# Patient Record
Sex: Male | Born: 1935 | Race: White | Hispanic: No | Marital: Married | Smoking: Former smoker
Health system: Southern US, Community
[De-identification: ages and names within clinical notes are randomized; demographics above are authoritative.]

## PROBLEM LIST (undated history)

## (undated) DIAGNOSIS — M1990 Unspecified osteoarthritis, unspecified site: Secondary | ICD-10-CM

## (undated) DIAGNOSIS — E119 Type 2 diabetes mellitus without complications: Secondary | ICD-10-CM

## (undated) DIAGNOSIS — I482 Chronic atrial fibrillation, unspecified: Secondary | ICD-10-CM

## (undated) DIAGNOSIS — E559 Vitamin D deficiency, unspecified: Secondary | ICD-10-CM

## (undated) DIAGNOSIS — N200 Calculus of kidney: Secondary | ICD-10-CM

## (undated) DIAGNOSIS — Z95810 Presence of automatic (implantable) cardiac defibrillator: Secondary | ICD-10-CM

## (undated) DIAGNOSIS — I428 Other cardiomyopathies: Secondary | ICD-10-CM

## (undated) DIAGNOSIS — K228 Other specified diseases of esophagus: Secondary | ICD-10-CM

## (undated) DIAGNOSIS — I429 Cardiomyopathy, unspecified: Secondary | ICD-10-CM

## (undated) DIAGNOSIS — E039 Hypothyroidism, unspecified: Secondary | ICD-10-CM

## (undated) DIAGNOSIS — I10 Essential (primary) hypertension: Secondary | ICD-10-CM

## (undated) DIAGNOSIS — K219 Gastro-esophageal reflux disease without esophagitis: Secondary | ICD-10-CM

## (undated) DIAGNOSIS — I5022 Chronic systolic (congestive) heart failure: Secondary | ICD-10-CM

## (undated) DIAGNOSIS — E785 Hyperlipidemia, unspecified: Secondary | ICD-10-CM

## (undated) DIAGNOSIS — J309 Allergic rhinitis, unspecified: Secondary | ICD-10-CM

## (undated) DIAGNOSIS — K2281 Esophageal polyp: Secondary | ICD-10-CM

## (undated) DIAGNOSIS — Z85828 Personal history of other malignant neoplasm of skin: Secondary | ICD-10-CM

## (undated) HISTORY — PX: ESOPHAGOGASTRODUODENOSCOPY: SHX1529

## (undated) HISTORY — PX: COLONOSCOPY: SHX174

## (undated) HISTORY — DX: Esophageal polyp: K22.81

## (undated) HISTORY — PX: CATARACT EXTRACTION: SUR2

## (undated) HISTORY — DX: Personal history of other malignant neoplasm of skin: Z85.828

## (undated) HISTORY — DX: Hyperlipidemia, unspecified: E78.5

## (undated) HISTORY — DX: Vitamin D deficiency, unspecified: E55.9

## (undated) HISTORY — DX: Cardiomyopathy, unspecified: I42.9

## (undated) HISTORY — DX: Presence of automatic (implantable) cardiac defibrillator: Z95.810

## (undated) HISTORY — DX: Gastro-esophageal reflux disease without esophagitis: K21.9

## (undated) HISTORY — DX: Unspecified osteoarthritis, unspecified site: M19.90

## (undated) HISTORY — DX: Other specified diseases of esophagus: K22.8

## (undated) HISTORY — DX: Type 2 diabetes mellitus without complications: E11.9

## (undated) HISTORY — DX: Chronic atrial fibrillation, unspecified: I48.20

## (undated) HISTORY — DX: Calculus of kidney: N20.0

## (undated) HISTORY — PX: OTHER SURGICAL HISTORY: SHX169

## (undated) HISTORY — DX: Allergic rhinitis, unspecified: J30.9

## (undated) HISTORY — DX: Hypothyroidism, unspecified: E03.9

## (undated) HISTORY — DX: Essential (primary) hypertension: I10

## (undated) HISTORY — DX: Other cardiomyopathies: I42.8

## (undated) HISTORY — DX: Chronic systolic (congestive) heart failure: I50.22

## (undated) HISTORY — PX: TONSILLECTOMY: SUR1361

---

## 2013-09-09 HISTORY — PX: CARDIAC CATHETERIZATION: SHX172

## 2014-08-09 DIAGNOSIS — Z95810 Presence of automatic (implantable) cardiac defibrillator: Secondary | ICD-10-CM

## 2014-08-09 HISTORY — DX: Presence of automatic (implantable) cardiac defibrillator: Z95.810

## 2015-01-07 HISTORY — PX: CARDIAC DEFIBRILLATOR PLACEMENT: SHX171

## 2017-02-08 ENCOUNTER — Encounter: Payer: Self-pay | Admitting: *Deleted

## 2017-02-08 ENCOUNTER — Encounter: Payer: Self-pay | Admitting: Cardiology

## 2017-02-08 NOTE — Progress Notes (Signed)
Cardiology Office Note  Date: 02/10/2017   ID: Marvin Sanchez, DOB 1934/10/15, MRN [id number]  PCP: Eber Hong, MD  Consulting Cardiologist: Rozann Lesches, MD   Chief Complaint  Patient presents with  . Cardiomyopathy  . Atrial Fibrillation    History of Present Illness: Marvin Sanchez is a 82 y.o. male referred for cardiology consultation by Dr. Brynda Greathouse to establish follow-up of cardiomyopathy and atrial fibrillation. He has been followed long-term by the [facility] cardiology practice, I reviewed the available records and updated the chart. History includes a nonischemic cardiomyopathy as well as chronic atrial fibrillation, reportedly with normal coronary arteries at cardiac catheterization in 2015. He also apparently had moderate to severe mitral regurgitation as of 2015, however this has improved by subsequent echocardiography. He underwent placement of an ICD in 2016.  He is here today with his daughter. They voice frustration with his previous cardiology practice. He has had increasing fatigue and shortness of breath over the last year, no exertional chest pain or palpitations. He states that his weights have been very stable, he rarely has to use any diuretic, has Bumex 1 mg tablets at home as needed.  Current medications reviewed below. He reports no recent changes. He states that he has been compliant with therapy. His last echocardiogram was in 2016 as summarized below.  He checks blood pressure at home, reports systolics generally in the 120s to 130s.  He does not report any syncope or device shocks.  I personally reviewed his ECG today which shows rate-controlled atrial fibrillation with low voltage in the limb leads.  Past Medical History:  Diagnosis Date  . Allergic rhinitis   . Cardiac defibrillator in place 2016   [place] EL ICD DF4 VR model # W6997659, serial [id number]   . Cardiomyopathy (HCC)    LVEF 25-30%  . Chronic atrial fibrillation ([place])   . Chronic  systolic heart failure ([place])   . Esophageal polyp   . Essential hypertension   . GERD (gastroesophageal reflux disease)   . History of skin cancer    Squamous cell  . Hyperlipidemia   . Hypothyroidism   . Nonischemic cardiomyopathy (HCC)    Normal coronary arteries at cardiac catheterization 2015  . Osteoarthritis   . Renal calculi   . Type 2 diabetes mellitus ([place])   . Vitamin D deficiency     Past Surgical History:  Procedure Laterality Date  . CARDIAC CATHETERIZATION  09/2013  . CATARACT EXTRACTION    . COLONOSCOPY    . ESOPHAGOGASTRODUODENOSCOPY    . SKIN CANCER REMOVAL     . TONSILLECTOMY      Current Outpatient Prescriptions  Medication Sig Dispense Refill  . amLODipine (NORVASC) 2.5 MG tablet Take 2.5 mg by mouth daily.    . cetirizine (ZYRTEC) 10 MG tablet Take 10 mg by mouth daily.    . cholecalciferol (VITAMIN D) 1000 units tablet Take 1,000 Units by mouth daily.    Marland Kitchen gabapentin (NEURONTIN) 300 MG capsule Take 300 mg by mouth 2 (two) times daily.    . irbesartan (AVAPRO) 150 MG tablet Take 150 mg by mouth daily.    Marland Kitchen levothyroxine (SYNTHROID, LEVOTHROID) 25 MCG tablet Take 25 mcg by mouth daily before breakfast.    . metoprolol succinate (TOPROL-XL) 100 MG 24 hr tablet Take 100 mg by mouth 2 (two) times daily.     . NON FORMULARY Inject 1,000 mcg into the muscle every 30 (thirty) days.    Marland Kitchen omeprazole (PRILOSEC) 20  MG capsule Take 20 mg by mouth daily.    . Rivaroxaban (XARELTO) 15 MG TABS tablet Take 15 mg by mouth daily with supper.     No current facility-administered medications for this visit.    Allergies:  Hyzaar [losartan potassium-hctz]   Social History: The patient  reports that he quit smoking about 48 years ago. His smoking use included Cigarettes. He has never used smokeless tobacco. He reports that he does not drink alcohol or use drugs.   Family History: The patient's family history includes Diabetes Mellitus II in his brother and sister; Heart  failure in his father; Prostate cancer in his father; Stroke in his mother.   ROS:  Please see the history of present illness. Otherwise, complete review of systems is positive for NYHA class 2-3 dyspnea.  All other systems are reviewed and negative.   Physical Exam: VS:  BP (!) 152/98 (BP Location: Right Arm, Cuff Size: Normal)   Pulse 76   Ht 5\' 10"  (1.778 m)   Wt 188 lb (85.3 kg)   SpO2 98%   BMI 26.98 kg/m , BMI Body mass index is 26.98 kg/m.  Wt Readings from Last 3 Encounters:  02/10/17 188 lb (85.3 kg)    General: Elderly male, appears comfortable at rest. HEENT: Conjunctiva and lids normal, oropharynx clear. Neck: Supple, no elevated JVP or carotid bruits, no thyromegaly. Lungs: Clear to auscultation, nonlabored breathing at rest. Cardiac: Irregularly irregular, no S3 or significant systolic murmur, no pericardial rub. Abdomen: Soft, nontender, bowel sounds present, no guarding or rebound. Extremities: No pitting edema, distal pulses 2+. Skin: Warm and dry. Musculoskeletal: No kyphosis. Neuropsychiatric: Alert and oriented x3, affect grossly appropriate.  ECG: Unable to review prior tracings.  Recent Labwork:  March 2018: Hemoglobin 17.1, platelets 228, potassium 4.6, BUN 25, creatinine 1.6, hemoglobin A1c 6.5, AST 20, ALT 24, cholesterol 185, triglycerides 189, HDL 34, LDL 114  Other Studies Reviewed Today:  Echocardiogram 11/07/2014 [facility]): Moderate LVH with LVEF 25-30%, normal right ventricular contraction, moderate left atrial enlargement, moderate right atrial enlargement, trivial mitral regurgitation, mild tricuspid regurgitation with RVSP 33 mmHg, normal aortic valve, no pericardial effusion  Assessment and Plan:  1. Nonischemic cardiomyopathy, LVEF 25-30% as of 2016. He had reportedly normal coronary arteries at cardiac catheterization in 2015. He reports exertional fatigue and shortness of breath over the last year, although stable weights, no orthopnea or  leg edema. Plan at this time is to obtain a follow-up echocardiogram and consider the possibility of medication adjustments as warranted. He does have renal insufficiency which limits some options.  2. [employer] ICD in place, no reported syncope or device shocks. He will be established in our device clinic with Dr. Rayann Heman.  3. Essential hypertension, medications currently include Toprol-XL, Avapro, and Norvasc.  4. CKD stage III, creatinine 1.6.  5. Persistent atrial fibrillation, heart rate controlled today. He is on renally dosed Xarelto for stroke prophylaxis.  Current medicines were reviewed with the patient today.   Orders Placed This Encounter  Procedures  . Ambulatory referral to Cardiac Electrophysiology  . EKG 12-Lead  . ECHOCARDIOGRAM COMPLETE    Disposition: Follow-up in the next 3 or 4 weeks.  Signed, Satira Sark, MD, [facility] 02/10/2017 1:48 PM    [facility] [place], [place],  [address] Phone: [phone]; Fax: [phone]

## 2017-02-10 ENCOUNTER — Encounter: Payer: Self-pay | Admitting: Cardiology

## 2017-02-10 ENCOUNTER — Ambulatory Visit (INDEPENDENT_AMBULATORY_CARE_PROVIDER_SITE_OTHER): Payer: Medicare Other | Admitting: Cardiology

## 2017-02-10 VITALS — BP 152/98 | HR 76 | Ht 70.0 in | Wt 188.0 lb

## 2017-02-10 DIAGNOSIS — N183 Chronic kidney disease, stage 3 unspecified: Secondary | ICD-10-CM

## 2017-02-10 DIAGNOSIS — I10 Essential (primary) hypertension: Secondary | ICD-10-CM

## 2017-02-10 DIAGNOSIS — Z95810 Presence of automatic (implantable) cardiac defibrillator: Secondary | ICD-10-CM

## 2017-02-10 DIAGNOSIS — I482 Chronic atrial fibrillation, unspecified: Secondary | ICD-10-CM

## 2017-02-10 DIAGNOSIS — I428 Other cardiomyopathies: Secondary | ICD-10-CM | POA: Diagnosis not present

## 2017-02-10 NOTE — Patient Instructions (Signed)
Medication Instructions:  Your physician recommends that you continue on your current medications as directed. Please refer to the Current Medication list given to you today.  Labwork: NONE  Testing/Procedures: Your physician has requested that you have an echocardiogram. Echocardiography is a painless test that uses sound waves to create images of your heart. It provides your doctor with information about the size and shape of your heart and how well your heart's chambers and valves are working. This procedure takes approximately one hour. There are no restrictions for this procedure.  Follow-Up: Your physician recommends that you schedule a follow-up appointment in: [place]  Any Other Special Instructions Will Be Listed Below (If Applicable). You have been referred to DR. ALLRED  If you need a refill on your cardiac medications before your next appointment, please call your pharmacy.

## 2017-02-15 ENCOUNTER — Other Ambulatory Visit: Payer: Self-pay | Admitting: *Deleted

## 2017-02-15 MED ORDER — METOPROLOL SUCCINATE ER 100 MG PO TB24: 100.0000 mg | ORAL_TABLET | Freq: Two times a day (BID) | ORAL | 3 refills | Status: DC

## 2017-02-15 MED ORDER — IRBESARTAN 150 MG PO TABS: 150.0000 mg | ORAL_TABLET | Freq: Every day | ORAL | 3 refills | Status: DC

## 2017-02-23 ENCOUNTER — Ambulatory Visit (INDEPENDENT_AMBULATORY_CARE_PROVIDER_SITE_OTHER): Payer: Medicare Other

## 2017-02-23 ENCOUNTER — Other Ambulatory Visit: Payer: Self-pay

## 2017-02-23 DIAGNOSIS — I428 Other cardiomyopathies: Secondary | ICD-10-CM

## 2017-02-24 ENCOUNTER — Telehealth: Payer: Self-pay

## 2017-02-24 NOTE — Telephone Encounter (Signed)
Patient notified. Patient does not wish to change medication at this time. Routed to PCP

## 2017-02-24 NOTE — Telephone Encounter (Signed)
-----   Message from Acquanetta Chain, LPN sent at 1/61/0960  7:53 AM EDT -----   ----- Message ----- From: Satira Sark, MD Sent: 02/23/2017   4:56 PM To: Merlene Laughter, LPN  Results reviewed. LVEF now in 40-45% range (had been worse in past based on records) and degree of mitral regurgitation is only mild (had been severe). We could consider trying a switch from Avapro to [facility] beginning at 24/26 mg BID. If change is made would check BMET in 2 weeks. Keep follow-up visit. A copy of this test should be forwarded to Eber Hong, MD.

## 2017-02-28 ENCOUNTER — Ambulatory Visit (INDEPENDENT_AMBULATORY_CARE_PROVIDER_SITE_OTHER): Payer: Medicare Other | Admitting: Internal Medicine

## 2017-02-28 ENCOUNTER — Encounter: Payer: Self-pay | Admitting: Internal Medicine

## 2017-02-28 VITALS — BP 138/98 | HR 92 | Ht 70.0 in | Wt 188.2 lb

## 2017-02-28 DIAGNOSIS — I428 Other cardiomyopathies: Secondary | ICD-10-CM | POA: Diagnosis not present

## 2017-02-28 DIAGNOSIS — I4821 Permanent atrial fibrillation: Secondary | ICD-10-CM

## 2017-02-28 DIAGNOSIS — I482 Chronic atrial fibrillation: Secondary | ICD-10-CM

## 2017-02-28 DIAGNOSIS — Z95810 Presence of automatic (implantable) cardiac defibrillator: Secondary | ICD-10-CM | POA: Diagnosis not present

## 2017-02-28 NOTE — Progress Notes (Signed)
Marvin Hong, MD: Primary Cardiologist:  Dr Evangeline Gula is a 82 y.o. male with a h/o nonischemic CM, chronic systolic dysfunction and permanent afib sp ICD [facility]) by [employer] physicians for primary prevention of sudden death who presents today to establish care in the Electrophysiology device clinic.   The patient reports doing very well since having his ICD implanted and remains very active despite his age.  He has permanent afib for which he is rate controlled and anticoagulated.  Today, he  denies symptoms of palpitations, chest pain, shortness of breath, orthopnea, PND, lower extremity edema, dizziness, presyncope, syncope, or neurologic sequela.  The patientis tolerating medications without difficulties and is otherwise without complaint today.   Past Medical History:  Diagnosis Date  . Allergic rhinitis   . Cardiac defibrillator in place 2016   [place] EL ICD DF4 VR model # [id number], serial [id number]   . Cardiomyopathy (HCC)    LVEF 25-30%  . Chronic atrial fibrillation ([place])   . Chronic systolic heart failure ([place])   . Esophageal polyp   . Essential hypertension   . GERD (gastroesophageal reflux disease)   . History of skin cancer    Squamous cell  . Hyperlipidemia   . Hypothyroidism   . Nonischemic cardiomyopathy (HCC)    Normal coronary arteries at cardiac catheterization 2015  . Osteoarthritis   . Renal calculi   . Type 2 diabetes mellitus ([place])   . Vitamin D deficiency    Past Surgical History:  Procedure Laterality Date  . CARDIAC CATHETERIZATION  09/2013  . CATARACT EXTRACTION    . COLONOSCOPY    . ESOPHAGOGASTRODUODENOSCOPY    . SKIN CANCER REMOVAL     . TONSILLECTOMY      Social History   Social History  . Marital status: Married    Spouse name: N/A  . Number of children: N/A  . Years of education: N/A   Occupational History  . Not on file.   Social History Main Topics  . Smoking status: Former Smoker    Types:  Cigarettes    Quit date: 08/11/1968  . Smokeless tobacco: Never Used  . Alcohol use No  . Drug use: No  . Sexual activity: Not on file   Other Topics Concern  . Not on file   Social History Narrative   Lives in [place] (between [place] and [place]).   Retired from [employer] Engineer, production)    Family History  Problem Relation Age of Onset  . Prostate cancer Father   . Heart failure Father   . Stroke Mother   . Diabetes Mellitus II Sister   . Diabetes Mellitus II Brother     Allergies  Allergen Reactions  . Hyzaar [Losartan Potassium-Hctz]     Affected his kidneys     Current Outpatient Prescriptions  Medication Sig Dispense Refill  . amLODipine (NORVASC) 2.5 MG tablet Take 2.5 mg by mouth daily.    . cetirizine (ZYRTEC) 10 MG tablet Take 10 mg by mouth daily.    . cholecalciferol (VITAMIN D) 1000 units tablet Take 1,000 Units by mouth daily.    . Cyanocobalamin 1000 MCG/ML KIT Inject 1,000 mcg as directed every 30 (thirty) days.    Marland Kitchen gabapentin (NEURONTIN) 300 MG capsule Take 300 mg by mouth 2 (two) times daily.    . irbesartan (AVAPRO) 150 MG tablet Take 1 tablet (150 mg total) by mouth daily. 90 tablet 3  . levothyroxine (SYNTHROID, LEVOTHROID) 25  MCG tablet Take 25 mcg by mouth daily before breakfast.    . metoprolol succinate (TOPROL-XL) 100 MG 24 hr tablet Take 1 tablet (100 mg total) by mouth 2 (two) times daily. 180 tablet 3  . omeprazole (PRILOSEC) 20 MG capsule Take 20 mg by mouth daily.    . Rivaroxaban (XARELTO) 15 MG TABS tablet Take 15 mg by mouth daily with supper.     No current facility-administered medications for this visit.     ROS- all systems are reviewed and negative except as per HPI  Physical Exam: Vitals:   02/28/17 1510  BP: (!) 138/98  Pulse: 92  SpO2: 96%  Weight: 188 lb 3.2 oz (85.4 kg)  Height: '5\' 10"'  (1.778 m)    GEN- The patient is well appearing, alert and oriented x 3 today.   Head- normocephalic,  atraumatic Eyes-  Sclera clear, conjunctiva pink Ears- hearing intact Oropharynx- clear Neck- supple,   Lungs- Clear to ausculation bilaterally, normal work of breathing Chest- ICD pocket is well healed Heart- irregular rate and rhythm, no murmurs, rubs or gallops, PMI not laterally displaced GI- soft, NT, ND, + BS Extremities- no clubbing, cyanosis, or edema MS- no significant deformity or atrophy Skin- no rash or lesion Psych- euthymic mood, full affect Neuro- strength and sensation are intact  ICD interrogation- reviewed in detail today,  See PACEART report  Echo 02/23/17 reveals EF 40-45%, mild MR, severe LA enlargement  ekg 02/10/17 reveals afib, V rate 73 bpm  Labs in epic reviewed,  CrCl is 45  Assessment and Plan:   1. Nonischemic CM/ chronic systolic dysfunction euvolemic today On good medical therapy S/p ICD for primary prevention of sudden death Normal ICD function No changes today Will follow remotely with [facility]  2. Permanent afib ICD interrogation reveals very good rate control On xarelto.   (creat clearance if 45)  [facility] Follow-up with Dr Domenic Polite as scheduled I will see again in [place] in a year  Thompson Grayer MD, [facility] 02/28/2017 3:57 PM

## 2017-02-28 NOTE — Patient Instructions (Addendum)
Medication Instructions:  Your physician recommends that you continue on your current medications as directed. Please refer to the Current Medication list given to you today.   Labwork: None ordered   Testing/Procedures: None ordered   Follow-Up: Remote monitoring is used to monitor your ICD from home. This monitoring reduces the number of office visits required to check your device to one time per year. It allows [place] to keep an eye on the functioning of your device to ensure it is working properly. You are scheduled for a device check from home on 05/30/17. You may send your transmission at any time that day. If you have a wireless device, the transmission will be sent automatically. After your physician reviews your transmission, you will receive a postcard with your next transmission date.  Remote monitoring is used to monitor your ICD from home. This monitoring reduces the number of office visits required to check your device to one time per year. It allows [place] to keep an eye on the functioning of your device to ensure it is working properly. You are scheduled for a device check from home on 12 months with Dr Rayann Heman in [place]. You may send your transmission at any time that day. If you have a wireless device, the transmission will be sent automatically. After your physician reviews your transmission, you will receive a postcard with your next transmission date.      Any Other Special Instructions Will Be Listed Below (If Applicable).     If you need a refill on your cardiac medications before your next appointment, please call your pharmacy.

## 2017-03-04 LAB — CUP PACEART INCLINIC DEVICE CHECK
HighPow Impedance: 54 Ohm
Implantable Lead Implant Date: 20160531
Implantable Lead Model: 296
Implantable Lead Serial Number: 129098
Lead Channel Pacing Threshold Pulse Width: 0.4 ms
Lead Channel Sensing Intrinsic Amplitude: 7.2 mV
MDC IDC LEAD LOCATION: 753860
MDC IDC MSMT LEADCHNL RV IMPEDANCE VALUE: 452 Ohm
MDC IDC MSMT LEADCHNL RV PACING THRESHOLD AMPLITUDE: 1 V
MDC IDC PG IMPLANT DT: 20160531
MDC IDC PG SERIAL: 206098
MDC IDC SESS DTM: 20180723040000
MDC IDC SET LEADCHNL RV PACING AMPLITUDE: 2.5 V
MDC IDC SET LEADCHNL RV PACING PULSEWIDTH: 0.4 ms
MDC IDC SET LEADCHNL RV SENSING SENSITIVITY: 0.6 mV

## 2017-03-08 NOTE — Progress Notes (Signed)
Cardiology Office Note  Date: 03/09/2017   ID: RAKAN SOFFER, DOB 27-Apr-1935, MRN [id number]  PCP: Eber Hong, MD  Primary Cardiologist: Rozann Lesches, MD   Chief Complaint  Patient presents with  . Cardiomyopathy    History of Present Illness: Marvin Sanchez is a 82 y.o. male that I met recently in early July. He is here today with his daughter for a follow-up visit. He tells me today that he actually feels better, less fatigue, not particularly short of breath, and not experiencing any exertional chest pain. His weight has been stable. No orthopnea or PND.  At the last visit we arranged a follow-up echocardiogram which revealed LVEF 40-45%, somewhat better than previously assessed. Mitral regurgitation was only mild. I went over the results with him today. He reports compliance with his medications, outlined below. I did talk about the possibility of switching to [facility], but he is feeling better and comfortable remaining on the current regimen.  He has established in the device clinic with Dr. Rayann Heman, recent visit noted. He has a Chemical engineer ICD in place. He reports no palpitations or syncope.  Past Medical History:  Diagnosis Date  . Allergic rhinitis   . Cardiac defibrillator in place 2016   [place] EL ICD DF4 VR model # [id number], serial [id number]   . Cardiomyopathy (HCC)    LVEF 25-30%  . Chronic atrial fibrillation ([place])   . Chronic systolic heart failure ([place])   . Esophageal polyp   . Essential hypertension   . GERD (gastroesophageal reflux disease)   . History of skin cancer    Squamous cell  . Hyperlipidemia   . Hypothyroidism   . Nonischemic cardiomyopathy (HCC)    Normal coronary arteries at cardiac catheterization 2015  . Osteoarthritis   . Renal calculi   . Type 2 diabetes mellitus ([place])   . Vitamin D deficiency     Past Surgical History:  Procedure Laterality Date  . CARDIAC CATHETERIZATION  09/2013  . CARDIAC DEFIBRILLATOR PLACEMENT   01/07/2015   [employer] VR ICD implanted by [facility] [employer] in [place]  . CATARACT EXTRACTION    . COLONOSCOPY    . ESOPHAGOGASTRODUODENOSCOPY    . SKIN CANCER REMOVAL     . TONSILLECTOMY      Current Outpatient Prescriptions  Medication Sig Dispense Refill  . amLODipine (NORVASC) 2.5 MG tablet Take 2.5 mg by mouth daily.    . cetirizine (ZYRTEC) 10 MG tablet Take 10 mg by mouth daily.    . cholecalciferol (VITAMIN D) 1000 units tablet Take 1,000 Units by mouth daily.    . Cyanocobalamin 1000 MCG/ML KIT Inject 1,000 mcg as directed every 30 (thirty) days.    Marland Kitchen gabapentin (NEURONTIN) 300 MG capsule Take 300 mg by mouth 2 (two) times daily.    . irbesartan (AVAPRO) 150 MG tablet Take 1 tablet (150 mg total) by mouth daily. 90 tablet 3  . levothyroxine (SYNTHROID, LEVOTHROID) 25 MCG tablet Take 25 mcg by mouth daily before breakfast.    . metoprolol succinate (TOPROL-XL) 100 MG 24 hr tablet Take 1 tablet (100 mg total) by mouth 2 (two) times daily. 180 tablet 3  . omeprazole (PRILOSEC) 20 MG capsule Take 20 mg by mouth daily.    . Rivaroxaban (XARELTO) 15 MG TABS tablet Take 15 mg by mouth daily with supper.     No current facility-administered medications for this visit.    Allergies:  Hyzaar [losartan potassium-hctz]   Social  History: The patient  reports that he quit smoking about 48 years ago. His smoking use included Cigarettes. He has never used smokeless tobacco. He reports that he does not drink alcohol or use drugs.   ROS:  Please see the history of present illness. Otherwise, complete review of systems is positive for hearing loss.  All other systems are reviewed and negative.   Physical Exam: VS:  BP 138/86   Pulse 67   Ht '5\' 10"'  (1.778 m)   Wt 189 lb 12.8 oz (86.1 kg)   SpO2 97%   BMI 27.23 kg/m , BMI Body mass index is 27.23 kg/m.  Wt Readings from Last 3 Encounters:  03/09/17 189 lb 12.8 oz (86.1 kg)  02/28/17 188 lb 3.2 oz (85.4 kg)    02/10/17 188 lb (85.3 kg)    General: Elderly male, appears comfortable at rest. HEENT: Conjunctiva and lids normal, oropharynx clear. Neck: Supple, no elevated JVP or carotid bruits, no thyromegaly. Lungs: Clear to auscultation, nonlabored breathing at rest. Cardiac: Irregularly irregular, no S3 or significant systolic murmur, no pericardial rub. Abdomen: Soft, nontender, bowel sounds present, no guarding or rebound. Extremities: No pitting edema, distal pulses 2+. Skin: Warm and dry. Musculoskeletal: No kyphosis. Neuropsychiatric: Alert and oriented x3, affect grossly appropriate.  ECG: I personally reviewed the tracing from 02/10/2017 which showed rate-controlled atrial fibrillation with low voltage and nonspecific T-wave changes.  Recent Labwork: No results found for requested labs within last 8760 hours.  No results found for: CHOL, TRIG, HDL, CHOLHDL, VLDL, LDLCALC, LDLDIRECT  Other Studies Reviewed Today:  Echocardiogram 02/23/2017: Study Conclusions  - Left ventricle: The cavity size was normal. Wall thickness was   increased in a pattern of mild LVH. Systolic function was mildly   to moderately reduced. The estimated ejection fraction was in the   range of 40% to 45%. Diffuse hypokinesis. - Aortic valve: Valve area (VTI): 1.59 cm^2. Valve area (Vmax):   1.71 cm^2. Valve area (Vmean): 1.77 cm^2. - Mitral valve: There was mild regurgitation. - Left atrium: The atrium was severely dilated. - Right ventricle: The cavity size was moderately dilated. Systolic   function was mildly reduced. - Right atrium: The atrium was mildly dilated. - Pulmonary arteries: Systolic pressure was mildly increased. PA   peak pressure: 34 mm Hg (S).  Assessment and Plan:  1. Nonischemic cardiomyopathy with LVEF up to the range of 40-45% by recent assessment. He had normal coronary arteries as of cardiac catheterization in 2015. He states that he is feeling better at this point. Would recommend  continuing current medical regimen. If symptoms worsen we can always make further adjustments and consider right heart catheterization or additional functional testing.  2. [employer] ICD in place. Now following with Dr. Rayann Heman in the device clinic. No device shocks.  3. Chronic atrial fibrillation, on Xarelto for stroke prophylaxis. He follows with Dr. Brynda Greathouse for regular lab work.  4. Essential hypertension continue with current regimen.  Current medicines were reviewed with the patient today.  Disposition: Follow-up in 6 months, sooner if needed.  Signed, Satira Sark, MD, [facility] 03/09/2017 4:07 PM    [facility] [place], [place], [place] [address] Phone: [phone]; Fax: [phone]

## 2017-03-09 ENCOUNTER — Encounter: Payer: Self-pay | Admitting: Cardiology

## 2017-03-09 ENCOUNTER — Ambulatory Visit (INDEPENDENT_AMBULATORY_CARE_PROVIDER_SITE_OTHER): Payer: Medicare Other | Admitting: Cardiology

## 2017-03-09 VITALS — BP 138/86 | HR 67 | Ht 70.0 in | Wt 189.8 lb

## 2017-03-09 DIAGNOSIS — I10 Essential (primary) hypertension: Secondary | ICD-10-CM | POA: Diagnosis not present

## 2017-03-09 DIAGNOSIS — I428 Other cardiomyopathies: Secondary | ICD-10-CM | POA: Diagnosis not present

## 2017-03-09 DIAGNOSIS — I482 Chronic atrial fibrillation, unspecified: Secondary | ICD-10-CM

## 2017-03-09 DIAGNOSIS — Z95810 Presence of automatic (implantable) cardiac defibrillator: Secondary | ICD-10-CM | POA: Diagnosis not present

## 2017-03-09 NOTE — Patient Instructions (Signed)

## 2017-05-30 ENCOUNTER — Ambulatory Visit (INDEPENDENT_AMBULATORY_CARE_PROVIDER_SITE_OTHER): Payer: Medicare Other | Admitting: *Deleted

## 2017-05-30 DIAGNOSIS — I428 Other cardiomyopathies: Secondary | ICD-10-CM

## 2017-05-30 NOTE — Progress Notes (Signed)
Remote ICD transmission.   

## 2017-06-01 LAB — CUP PACEART REMOTE DEVICE CHECK
Battery Remaining Percentage: 100 %
Date Time Interrogation Session: 20181022045000
HIGH POWER IMPEDANCE MEASURED VALUE: 50 Ohm
Lead Channel Impedance Value: 456 Ohm
Lead Channel Pacing Threshold Amplitude: 1 V
Lead Channel Setting Pacing Amplitude: 2.5 V
Lead Channel Setting Pacing Pulse Width: 0.4 ms
MDC IDC LEAD IMPLANT DT: 20160531
MDC IDC LEAD LOCATION: 753860
MDC IDC LEAD SERIAL: 129098
MDC IDC MSMT BATTERY REMAINING LONGEVITY: 144 mo
MDC IDC MSMT LEADCHNL RV PACING THRESHOLD PULSEWIDTH: 0.4 ms
MDC IDC PG IMPLANT DT: 20160531
MDC IDC SET LEADCHNL RV SENSING SENSITIVITY: 0.6 mV
MDC IDC STAT BRADY RV PERCENT PACED: 1 %
Pulse Gen Serial Number: 206098

## 2017-06-03 ENCOUNTER — Encounter: Payer: Self-pay | Admitting: Cardiology

## 2017-08-03 ENCOUNTER — Other Ambulatory Visit: Payer: Self-pay

## 2017-08-03 MED ORDER — RIVAROXABAN 15 MG PO TABS: 15.0000 mg | ORAL_TABLET | Freq: Every day | ORAL | 0 refills | Status: DC

## 2017-08-29 ENCOUNTER — Ambulatory Visit (INDEPENDENT_AMBULATORY_CARE_PROVIDER_SITE_OTHER): Payer: Medicare Other | Admitting: *Deleted

## 2017-08-29 DIAGNOSIS — I428 Other cardiomyopathies: Secondary | ICD-10-CM

## 2017-08-29 NOTE — Progress Notes (Signed)
Remote ICD transmission.   

## 2017-08-30 ENCOUNTER — Encounter: Payer: Self-pay | Admitting: Cardiology

## 2017-08-31 NOTE — Progress Notes (Signed)
Cardiology Office Note  Date: 09/02/2017   ID: RISHIKESH KHACHATRYAN, DOB 02-25-1935, MRN [id number]  PCP: Eber Hong, MD  Primary Cardiologist: Rozann Lesches, MD   Chief Complaint  Patient presents with  . Cardiomyopathy    History of Present Illness: Marvin Sanchez is an 83 y.o. male last seen in August 2018. He presents today with his wife for a routine follow-up visit. Describes no change in stamina since last encounter, has chronic dyspnea on exertion as before, particular with inclines. He has had no palpitations or syncope.  He is following in the device clinic with Dr. Rayann Heman, [facility] Scientific ICD in place. No reported device shocks.  Interval lab work is outlined below. He denies any bleeding problems on Xarelto.  Past Medical History:  Diagnosis Date  . Allergic rhinitis   . Cardiac defibrillator in place 2016   [place] EL ICD DF4 VR model # W6997659, serial [id number]   . Cardiomyopathy (HCC)    LVEF 25-30%  . Chronic atrial fibrillation ([place])   . Chronic systolic heart failure ([place])   . Esophageal polyp   . Essential hypertension   . GERD (gastroesophageal reflux disease)   . History of skin cancer    Squamous cell  . Hyperlipidemia   . Hypothyroidism   . Nonischemic cardiomyopathy (HCC)    Normal coronary arteries at cardiac catheterization 2015  . Osteoarthritis   . Renal calculi   . Type 2 diabetes mellitus ([place])   . Vitamin D deficiency     Past Surgical History:  Procedure Laterality Date  . CARDIAC CATHETERIZATION  09/2013  . CARDIAC DEFIBRILLATOR PLACEMENT  01/07/2015   [employer] VR ICD implanted by [employer] in [place]  . CATARACT EXTRACTION    . COLONOSCOPY    . ESOPHAGOGASTRODUODENOSCOPY    . SKIN CANCER REMOVAL     . TONSILLECTOMY      Current Outpatient Medications  Medication Sig Dispense Refill  . amLODipine (NORVASC) 2.5 MG tablet Take 2.5 mg by mouth daily.    . cetirizine (ZYRTEC) 10 MG tablet  Take 10 mg by mouth daily.    . cholecalciferol (VITAMIN D) 1000 units tablet Take 1,000 Units by mouth daily.    . Cyanocobalamin 1000 MCG/ML KIT Inject 1,000 mcg as directed every 30 (thirty) days.    Marland Kitchen gabapentin (NEURONTIN) 300 MG capsule Take 300 mg by mouth 2 (two) times daily.    . irbesartan (AVAPRO) 150 MG tablet Take 1 tablet (150 mg total) by mouth daily. 90 tablet 3  . levothyroxine (SYNTHROID, LEVOTHROID) 25 MCG tablet Take 25 mcg by mouth daily before breakfast.    . metoprolol succinate (TOPROL-XL) 100 MG 24 hr tablet Take 1 tablet (100 mg total) by mouth 2 (two) times daily. 180 tablet 3  . omeprazole (PRILOSEC) 20 MG capsule Take 20 mg by mouth daily.    . Rivaroxaban (XARELTO) 15 MG TABS tablet Take 1 tablet (15 mg total) by mouth daily with supper. 90 tablet 0   No current facility-administered medications for this visit.    Allergies:  Hyzaar [losartan potassium-hctz]   Social History: The patient  reports that he quit smoking about 49 years ago. His smoking use included cigarettes. he has never used smokeless tobacco. He reports that he does not drink alcohol or use drugs.   ROS:  Please see the history of present illness. Otherwise, complete review of systems is positive for none.  All other systems are reviewed  and negative.   Physical Exam: VS:  BP (!) 154/80   Pulse 77   Ht '5\' 10"'  (1.778 m)   Wt 191 lb (86.6 kg)   SpO2 95%   BMI 27.41 kg/m , BMI Body mass index is 27.41 kg/m.  Wt Readings from Last 3 Encounters:  09/02/17 191 lb (86.6 kg)  03/09/17 189 lb 12.8 oz (86.1 kg)  02/28/17 188 lb 3.2 oz (85.4 kg)    General: Elderly male, appears comfortable at rest. HEENT: Conjunctiva and lids normal, oropharynx clear. Neck: Supple, no elevated JVP or carotid bruits, no thyromegaly. Lungs: Clear to auscultation, nonlabored breathing at rest. Cardiac: Irregularly irregular, no S3 or significant systolic murmur, no pericardial rub. Abdomen: Soft, nontender,  bowel sounds present, no guarding or rebound. Extremities: No pitting edema, distal pulses 2+. Skin: Warm and dry. Musculoskeletal: No kyphosis. Neuropsychiatric: Alert and oriented x3, affect grossly appropriate.  ECG: I personally reviewed the tracing from 02/10/2017 which showed rate-controlled atrial fibrillation with low voltage and nonspecific T-wave changes.  Recent Labwork:  December 2018: Potassium 4.4, BUN 20, creatinine 1.3, hemoglobin A1c 6.1, hemoglobin 16.3, platelets 254, AST 21, ALT 26 April 2017: Cholesterol 150, triglycerides 90, HDL 31, LDL 101  Other Studies Reviewed Today:  Echocardiogram 02/23/2017: Study Conclusions  - Left ventricle: The cavity size was normal. Wall thickness was   increased in a pattern of mild LVH. Systolic function was mildly   to moderately reduced. The estimated ejection fraction was in the   range of 40% to 45%. Diffuse hypokinesis. - Aortic valve: Valve area (VTI): 1.59 cm^2. Valve area (Vmax):   1.71 cm^2. Valve area (Vmean): 1.77 cm^2. - Mitral valve: There was mild regurgitation. - Left atrium: The atrium was severely dilated. - Right ventricle: The cavity size was moderately dilated. Systolic   function was mildly reduced. - Right atrium: The atrium was mildly dilated. - Pulmonary arteries: Systolic pressure was mildly increased. PA   peak pressure: 34 mm Hg (S).  Assessment and Plan:  1. Nonischemic cardiomyopathy with LVEF 40-45%. Plan is to continue with medical therapy and observation at this time in the absence of progressive symptoms. Weight has been relatively stable.  2. Chronic atrial fibrillation. He continues on Xarelto for stroke prophylaxis. I reviewed interval lab work.  3. [employer] ICD, followed by Dr. Rayann Heman. No device shocks.  4. Essential hypertension, blood pressure is up today but patient has not taken his morning medicines yet.  Current medicines were reviewed with the patient  today.  Disposition: Follow-up in 6 months.  Signed, Satira Sark, MD, [facility] 09/02/2017 10:13 AM    [facility] [place], [place], [place] [address] Phone: [phone]; Fax: [phone]

## 2017-09-02 ENCOUNTER — Encounter: Payer: Self-pay | Admitting: Cardiology

## 2017-09-02 ENCOUNTER — Other Ambulatory Visit: Payer: Self-pay

## 2017-09-02 ENCOUNTER — Ambulatory Visit (INDEPENDENT_AMBULATORY_CARE_PROVIDER_SITE_OTHER): Payer: Medicare Other | Admitting: Cardiology

## 2017-09-02 VITALS — BP 154/80 | HR 77 | Ht 70.0 in | Wt 191.0 lb

## 2017-09-02 DIAGNOSIS — I429 Cardiomyopathy, unspecified: Secondary | ICD-10-CM | POA: Diagnosis not present

## 2017-09-02 DIAGNOSIS — Z95810 Presence of automatic (implantable) cardiac defibrillator: Secondary | ICD-10-CM | POA: Diagnosis not present

## 2017-09-02 DIAGNOSIS — I482 Chronic atrial fibrillation, unspecified: Secondary | ICD-10-CM

## 2017-09-02 DIAGNOSIS — I10 Essential (primary) hypertension: Secondary | ICD-10-CM

## 2017-09-02 LAB — CUP PACEART REMOTE DEVICE CHECK
Brady Statistic RV Percent Paced: 1 %
Date Time Interrogation Session: 20190121055200
HighPow Impedance: 50 Ohm
Implantable Lead Model: 296
Lead Channel Impedance Value: 443 Ohm
Lead Channel Pacing Threshold Amplitude: 1 V
Lead Channel Setting Pacing Amplitude: 2.5 V
Lead Channel Setting Pacing Pulse Width: 0.4 ms
MDC IDC LEAD IMPLANT DT: 20160531
MDC IDC LEAD LOCATION: 753860
MDC IDC LEAD SERIAL: 129098
MDC IDC MSMT BATTERY REMAINING LONGEVITY: 144 mo
MDC IDC MSMT BATTERY REMAINING PERCENTAGE: 100 %
MDC IDC MSMT LEADCHNL RV PACING THRESHOLD PULSEWIDTH: 0.4 ms
MDC IDC PG IMPLANT DT: 20160531
MDC IDC SET LEADCHNL RV SENSING SENSITIVITY: 0.6 mV
Pulse Gen Serial Number: 206098

## 2017-09-02 NOTE — Patient Instructions (Signed)

## 2017-11-10 ENCOUNTER — Other Ambulatory Visit: Payer: Self-pay | Admitting: Cardiology

## 2017-11-28 ENCOUNTER — Ambulatory Visit (INDEPENDENT_AMBULATORY_CARE_PROVIDER_SITE_OTHER): Payer: Medicare Other | Admitting: *Deleted

## 2017-11-28 DIAGNOSIS — I429 Cardiomyopathy, unspecified: Secondary | ICD-10-CM

## 2017-11-28 NOTE — Progress Notes (Signed)
Remote ICD transmission.   

## 2017-11-29 ENCOUNTER — Encounter: Payer: Self-pay | Admitting: Cardiology

## 2017-12-01 LAB — CUP PACEART REMOTE DEVICE CHECK
Battery Remaining Longevity: 144 mo
Brady Statistic RV Percent Paced: 1 %
Date Time Interrogation Session: 20190422045000
HighPow Impedance: 48 Ohm
Implantable Lead Implant Date: 20160531
Implantable Lead Location: 753860
Implantable Lead Model: 296
Implantable Lead Serial Number: 129098
Implantable Pulse Generator Implant Date: 20160531
Lead Channel Pacing Threshold Amplitude: 1 V
Lead Channel Pacing Threshold Pulse Width: 0.4 ms
Lead Channel Setting Pacing Pulse Width: 0.4 ms
MDC IDC MSMT BATTERY REMAINING PERCENTAGE: 100 %
MDC IDC MSMT LEADCHNL RV IMPEDANCE VALUE: 427 Ohm
MDC IDC PG SERIAL: 206098
MDC IDC SET LEADCHNL RV PACING AMPLITUDE: 2.5 V
MDC IDC SET LEADCHNL RV SENSING SENSITIVITY: 0.6 mV

## 2018-02-27 ENCOUNTER — Ambulatory Visit (INDEPENDENT_AMBULATORY_CARE_PROVIDER_SITE_OTHER): Payer: Medicare Other | Admitting: *Deleted

## 2018-02-27 DIAGNOSIS — I428 Other cardiomyopathies: Secondary | ICD-10-CM

## 2018-02-27 NOTE — Progress Notes (Signed)
Remote ICD transmission.   

## 2018-02-28 ENCOUNTER — Encounter: Payer: Self-pay | Admitting: Cardiology

## 2018-03-07 NOTE — Progress Notes (Signed)
Cardiology Office Note  Date: 03/08/2018   ID: Marvin BLAZEJEWSKI, DOB 1935/04/25, MRN [id number]  PCP: Eber Hong, MD  Primary Cardiologist: Rozann Lesches, MD   Chief Complaint  Patient presents with  . Cardiomyopathy  . Atrial Fibrillation    History of Present Illness: Marvin Sanchez is an 83 y.o. male last seen in January.  He is here today with his wife for a follow-up visit.  He reports no change in stamina, no angina symptoms, no palpitations or syncope.  He does some yard work, rides his lawnmower, otherwise limited by neuropathy in both legs.  He does not describe any leg swelling, orthopnea, or PND.  He sees Dr. Rayann Heman in the device clinic, [place] in place.  No device shocks.  I reviewed interval lab work from March.  Current cardiac regimen includes Norvasc, Avapro, Toprol-XL, and Xarelto.  He does not report any bleeding problems.  I personally reviewed his ECG today which shows rate controlled atrial fibrillation with low voltage, leftward axis, nonspecific T wave changes, and decreased R wave progression.  Past Medical History:  Diagnosis Date  . Allergic rhinitis   . Cardiac defibrillator in place 2016   [place] EL ICD DF4 VR model # [id number], serial [id number]   . Cardiomyopathy (HCC)    LVEF 25-30%  . Chronic atrial fibrillation ([place])   . Chronic systolic heart failure ([place])   . Esophageal polyp   . Essential hypertension   . GERD (gastroesophageal reflux disease)   . History of skin cancer    Squamous cell  . Hyperlipidemia   . Hypothyroidism   . Nonischemic cardiomyopathy (HCC)    Normal coronary arteries at cardiac catheterization 2015  . Osteoarthritis   . Renal calculi   . Type 2 diabetes mellitus ([place])   . Vitamin D deficiency     Past Surgical History:  Procedure Laterality Date  . CARDIAC CATHETERIZATION  09/2013  . CARDIAC DEFIBRILLATOR PLACEMENT  01/07/2015   [employer] VR ICD implanted by [employer] in [place]  . CATARACT EXTRACTION    . COLONOSCOPY    . ESOPHAGOGASTRODUODENOSCOPY    . SKIN CANCER REMOVAL     . TONSILLECTOMY      Current Outpatient Medications  Medication Sig Dispense Refill  . amLODipine (NORVASC) 2.5 MG tablet Take 2.5 mg by mouth daily.    . cetirizine (ZYRTEC) 10 MG tablet Take 10 mg by mouth daily.    . cholecalciferol (VITAMIN D) 1000 units tablet Take 1,000 Units by mouth daily.    . Cyanocobalamin 1000 MCG/ML KIT Inject 1,000 mcg as directed every 30 (thirty) days.    Marland Kitchen gabapentin (NEURONTIN) 300 MG capsule Take 300 mg by mouth 2 (two) times daily.    . irbesartan (AVAPRO) 150 MG tablet Take 1 tablet (150 mg total) by mouth daily. 90 tablet 3  . levothyroxine (SYNTHROID, LEVOTHROID) 25 MCG tablet Take 25 mcg by mouth daily before breakfast.    . metoprolol succinate (TOPROL-XL) 100 MG 24 hr tablet Take 1 tablet (100 mg total) by mouth 2 (two) times daily. 180 tablet 3  . omeprazole (PRILOSEC) 20 MG capsule Take 20 mg by mouth daily.    Alveda Reasons 15 MG TABS tablet TAKE 1 TABLET (15 MG TOTAL) BY MOUTH DAILY WITH SUPPER. 90 tablet 1   No current facility-administered medications for this visit.    Allergies:  Hyzaar [losartan potassium-hctz]   Social History: The patient  reports  that he quit smoking about 49 years ago. His smoking use included cigarettes. He has never used smokeless tobacco. He reports that he does not drink alcohol or use drugs.   ROS:  Please see the history of present illness. Otherwise, complete review of systems is positive for hearing loss.  All other systems are reviewed and negative.   Physical Exam: VS:  BP 129/80   Pulse 72   Ht '5\' 10"'  (1.778 m)   Wt 191 lb 3.2 oz (86.7 kg)   SpO2 96%   BMI 27.43 kg/m , BMI Body mass index is 27.43 kg/m.  Wt Readings from Last 3 Encounters:  03/08/18 191 lb 3.2 oz (86.7 kg)  09/02/17 191 lb (86.6 kg)  03/09/17 189 lb 12.8 oz (86.1 kg)    General: Elderly male, appears  comfortable at rest. HEENT: Conjunctiva and lids normal, oropharynx clear. Neck: Supple, no elevated JVP or carotid bruits, no thyromegaly. Lungs: Clear to auscultation, nonlabored breathing at rest. Cardiac: Irregularly irregular, no S3 or significant systolic murmur. Abdomen: Soft, nontender, bowel sounds present. Extremities: No pitting edema, distal pulses 2+. Skin: Warm and dry. Musculoskeletal: No kyphosis. Neuropsychiatric: Alert and oriented x3, affect grossly appropriate.  ECG: I personally reviewed the tracing from 02/10/2017 which showed rate-controlled atrial fibrillation with low voltage and nonspecific T-wave changes.  Recent Labwork:  March 2019: Hemoglobin 16.9, platelets 240, potassium 4.1, BUN 21, creatinine 1.63, AST 34, ALT 23, cholesterol 176, triglycerides 183, HDL 33, LDL 107, TSH 4.38  Other Studies Reviewed Today:  Echocardiogram 02/23/2017: Study Conclusions  - Left ventricle: The cavity size was normal. Wall thickness was increased in a pattern of mild LVH. Systolic function was mildly to moderately reduced. The estimated ejection fraction was in the range of 40% to 45%. Diffuse hypokinesis. - Aortic valve: Valve area (VTI): 1.59 cm^2. Valve area (Vmax): 1.71 cm^2. Valve area (Vmean): 1.77 cm^2. - Mitral valve: There was mild regurgitation. - Left atrium: The atrium was severely dilated. - Right ventricle: The cavity size was moderately dilated. Systolic function was mildly reduced. - Right atrium: The atrium was mildly dilated. - Pulmonary arteries: Systolic pressure was mildly increased. PA peak pressure: 34 mm Hg (S).  Assessment and Plan:  1. Chronic systolic heart failure with nonischemic cardiomyopathy, LVEF 40 to 45%.  He has been clinically stable, no significant change in weight.  He reports compliance with his medications which are outlined above.  No changes made today.  2.  Permanent atrial fibrillation.  Heart rate control is  adequate and continues on renally dosed Xarelto for stroke prophylaxis.  Lab work from March reviewed.  He reports no bleeding problems.  3.  [employer] ICD in place, no device shocks.  Keep follow-up with Dr. Rayann Heman.  4.  Essential hypertension, blood pressure control is adequate today.  Current medicines were reviewed with the patient today.   Orders Placed This Encounter  Procedures  . EKG 12-Lead    Disposition: Follow-up in 6 months.  Signed, Satira Sark, MD, [facility] 03/08/2018 11:18 AM    [facility] [place], [place], [place] [address] Phone: [phone]; Fax: [phone]

## 2018-03-08 ENCOUNTER — Ambulatory Visit (INDEPENDENT_AMBULATORY_CARE_PROVIDER_SITE_OTHER): Payer: Medicare Other | Admitting: Cardiology

## 2018-03-08 ENCOUNTER — Encounter: Payer: Self-pay | Admitting: Cardiology

## 2018-03-08 VITALS — BP 129/80 | HR 72 | Ht 70.0 in | Wt 191.2 lb

## 2018-03-08 DIAGNOSIS — I5022 Chronic systolic (congestive) heart failure: Secondary | ICD-10-CM | POA: Diagnosis not present

## 2018-03-08 DIAGNOSIS — Z95810 Presence of automatic (implantable) cardiac defibrillator: Secondary | ICD-10-CM

## 2018-03-08 DIAGNOSIS — I428 Other cardiomyopathies: Secondary | ICD-10-CM

## 2018-03-08 DIAGNOSIS — I10 Essential (primary) hypertension: Secondary | ICD-10-CM | POA: Diagnosis not present

## 2018-03-08 NOTE — Patient Instructions (Signed)

## 2018-03-23 LAB — CUP PACEART REMOTE DEVICE CHECK
Battery Remaining Longevity: 144 mo
Battery Remaining Percentage: 100 %
Date Time Interrogation Session: 20190722045100
HIGH POWER IMPEDANCE MEASURED VALUE: 46 Ohm
Implantable Lead Location: 753860
Implantable Lead Serial Number: 129098
Implantable Pulse Generator Implant Date: 20160531
Lead Channel Pacing Threshold Amplitude: 1 V
Lead Channel Pacing Threshold Pulse Width: 0.4 ms
Lead Channel Setting Pacing Amplitude: 2.5 V
Lead Channel Setting Pacing Pulse Width: 0.4 ms
Lead Channel Setting Sensing Sensitivity: 0.6 mV
MDC IDC LEAD IMPLANT DT: 20160531
MDC IDC MSMT LEADCHNL RV IMPEDANCE VALUE: 429 Ohm
MDC IDC STAT BRADY RV PERCENT PACED: 1 %
Pulse Gen Serial Number: 206098

## 2018-04-19 ENCOUNTER — Other Ambulatory Visit: Payer: Self-pay | Admitting: Cardiology

## 2018-04-25 ENCOUNTER — Other Ambulatory Visit: Payer: Self-pay | Admitting: Cardiology

## 2018-05-12 ENCOUNTER — Encounter: Payer: Medicare Other | Admitting: Internal Medicine

## 2018-05-29 ENCOUNTER — Ambulatory Visit (INDEPENDENT_AMBULATORY_CARE_PROVIDER_SITE_OTHER): Payer: Medicare Other | Admitting: *Deleted

## 2018-05-29 DIAGNOSIS — I5022 Chronic systolic (congestive) heart failure: Secondary | ICD-10-CM

## 2018-05-29 DIAGNOSIS — I428 Other cardiomyopathies: Secondary | ICD-10-CM

## 2018-05-29 NOTE — Progress Notes (Signed)
Remote ICD transmission.   

## 2018-05-30 ENCOUNTER — Encounter: Payer: Self-pay | Admitting: Cardiology

## 2018-06-09 ENCOUNTER — Ambulatory Visit (INDEPENDENT_AMBULATORY_CARE_PROVIDER_SITE_OTHER): Payer: Medicare Other | Admitting: Internal Medicine

## 2018-06-09 ENCOUNTER — Encounter: Payer: Self-pay | Admitting: Internal Medicine

## 2018-06-09 VITALS — BP 134/82 | HR 66 | Ht 70.0 in | Wt 191.8 lb

## 2018-06-09 DIAGNOSIS — I10 Essential (primary) hypertension: Secondary | ICD-10-CM

## 2018-06-09 DIAGNOSIS — I428 Other cardiomyopathies: Secondary | ICD-10-CM | POA: Diagnosis not present

## 2018-06-09 DIAGNOSIS — I4821 Permanent atrial fibrillation: Secondary | ICD-10-CM

## 2018-06-09 DIAGNOSIS — I5022 Chronic systolic (congestive) heart failure: Secondary | ICD-10-CM | POA: Diagnosis not present

## 2018-06-09 DIAGNOSIS — Z95810 Presence of automatic (implantable) cardiac defibrillator: Secondary | ICD-10-CM | POA: Diagnosis not present

## 2018-06-09 LAB — CUP PACEART INCLINIC DEVICE CHECK
Date Time Interrogation Session: 20191101040000
HighPow Impedance: 52 Ohm
Implantable Lead Location: 753860
Implantable Lead Model: 296
Implantable Pulse Generator Implant Date: 20160531
Lead Channel Impedance Value: 436 Ohm
Lead Channel Pacing Threshold Amplitude: 1 V
Lead Channel Pacing Threshold Pulse Width: 0.4 ms
Lead Channel Setting Pacing Pulse Width: 0.4 ms
Lead Channel Setting Sensing Sensitivity: 0.6 mV
MDC IDC LEAD IMPLANT DT: 20160531
MDC IDC LEAD SERIAL: 129098
MDC IDC MSMT LEADCHNL RV SENSING INTR AMPL: 6.4 mV
MDC IDC PG SERIAL: 206098
MDC IDC SET LEADCHNL RV PACING AMPLITUDE: 2.5 V

## 2018-06-09 NOTE — Patient Instructions (Signed)
Medication Instructions:  Continue all current medications.  Labwork: none  Testing/Procedures: none  Follow-Up: 1 year   Any Other Special Instructions Will Be Listed Below (If Applicable). Remote monitoring is used to monitor your Pacemaker of ICD from home. This monitoring reduces the number of office visits required to check your device to one time per year. It allows [place] to keep an eye on the functioning of your device to ensure it is working properly. You are scheduled for a device check from home on 08/28/2018. You may send your transmission at any time that day. If you have a wireless device, the transmission will be sent automatically. After your physician reviews your transmission, you will receive a postcard with your next transmission date.  If you need a refill on your cardiac medications before your next appointment, please call your pharmacy.

## 2018-06-09 NOTE — Progress Notes (Signed)
PCP: Eber Hong, MD Primary Cardiologist: Dr Domenic Polite Primary EP: Dr Memory Argue is a 83 y.o. male who presents today for routine electrophysiology followup.  Since last being seen in our clinic, the patient reports doing very well.  Today, he denies symptoms of palpitations, chest pain, shortness of breath,  lower extremity edema, dizziness, presyncope, syncope, or ICD shocks.  The patient is otherwise without complaint today.   Past Medical History:  Diagnosis Date  . Allergic rhinitis   . Cardiac defibrillator in place 2016   [place] EL ICD DF4 VR model # [id number], serial [id number]   . Cardiomyopathy (HCC)    LVEF 25-30%  . Chronic atrial fibrillation   . Chronic systolic heart failure ([place])   . Esophageal polyp   . Essential hypertension   . GERD (gastroesophageal reflux disease)   . History of skin cancer    Squamous cell  . Hyperlipidemia   . Hypothyroidism   . Nonischemic cardiomyopathy (HCC)    Normal coronary arteries at cardiac catheterization 2015  . Osteoarthritis   . Renal calculi   . Type 2 diabetes mellitus ([place])   . Vitamin D deficiency    Past Surgical History:  Procedure Laterality Date  . CARDIAC CATHETERIZATION  09/2013  . CARDIAC DEFIBRILLATOR PLACEMENT  01/07/2015   [employer] VR ICD implanted by [employer] in [place]  . CATARACT EXTRACTION    . COLONOSCOPY    . ESOPHAGOGASTRODUODENOSCOPY    . SKIN CANCER REMOVAL     . TONSILLECTOMY      ROS- all systems are reviewed and negative except as per HPI above  Current Outpatient Medications  Medication Sig Dispense Refill  . amLODipine (NORVASC) 2.5 MG tablet Take 2.5 mg by mouth daily.    . cetirizine (ZYRTEC) 10 MG tablet Take 10 mg by mouth daily.    . cholecalciferol (VITAMIN D) 1000 units tablet Take 1,000 Units by mouth daily.    . Cyanocobalamin 1000 MCG/ML KIT Inject 1,000 mcg as directed every 30 (thirty) days.    Marland Kitchen gabapentin (NEURONTIN) 300  MG capsule Take 300 mg by mouth 2 (two) times daily.    . irbesartan (AVAPRO) 150 MG tablet TAKE 1 TABLET EVERY DAY 90 tablet 3  . levothyroxine (SYNTHROID, LEVOTHROID) 25 MCG tablet Take 25 mcg by mouth daily before breakfast.    . metoprolol succinate (TOPROL-XL) 100 MG 24 hr tablet TAKE 1 TABLET TWICE DAILY 180 tablet 3  . omeprazole (PRILOSEC) 20 MG capsule Take 20 mg by mouth daily.    Alveda Reasons 15 MG TABS tablet TAKE 1 TABLET (15 MG TOTAL) BY MOUTH DAILY WITH SUPPER. 90 tablet 1   No current facility-administered medications for this visit.     Physical Exam: Vitals:   06/09/18 0854  BP: 134/82  Pulse: 66  Weight: 191 lb 12.8 oz (87 kg)  Height: '5\' 10"'  (1.778 m)    GEN- The patient is well appearing, alert and oriented x 3 today.   Head- normocephalic, atraumatic Eyes-  Sclera clear, conjunctiva pink Ears- hearing intact Oropharynx- clear Lungs- Clear to ausculation bilaterally, normal work of breathing Chest- ICD pocket is well healed Heart- Regular rate and rhythm, no murmurs, rubs or gallops, PMI not laterally displaced GI- soft, NT, ND, + BS Extremities- no clubbing, cyanosis, or edema  ICD interrogation- reviewed in detail today,  See PACEART report    Wt Readings from Last 3 Encounters:  06/09/18 191 lb 12.8 oz (  87 kg)  03/08/18 191 lb 3.2 oz (86.7 kg)  09/02/17 191 lb (86.6 kg)    Assessment and Plan:  1.  Chronic systolic dysfunction/ nonischemic CM euvolemic today EF 45% Stable on an appropriate medical regimen Normal ICD function See Pace Art report No changes today  2. Permanent afib Rate controlled On xarelto  3. HTN Stable No change required today   [place] Return in year   Thompson Grayer MD, [facility] 06/09/2018 9:26 AM

## 2018-07-01 LAB — CUP PACEART REMOTE DEVICE CHECK
Implantable Lead Implant Date: 20160531
Implantable Lead Location: 753860
Implantable Lead Model: 296
Implantable Lead Serial Number: 129098
Implantable Pulse Generator Implant Date: 20160531
Lead Channel Setting Sensing Sensitivity: 0.6 mV
MDC IDC SESS DTM: 20191123200054
MDC IDC SET LEADCHNL RV PACING AMPLITUDE: 2.5 V
MDC IDC SET LEADCHNL RV PACING PULSEWIDTH: 0.4 ms
Pulse Gen Serial Number: 206098

## 2018-07-11 ENCOUNTER — Telehealth: Payer: Self-pay | Admitting: Cardiology

## 2018-07-11 NOTE — Telephone Encounter (Signed)
Patient called stating that he has a sore throat, coughing. Wants to know what he can take with his heart medications.   [phone]

## 2018-07-11 NOTE — Telephone Encounter (Signed)
Pt wife Thayer Headings [facility]) made aware that pt could take tylenol or Mucinex for a few days - if symptoms worsen or fever develops pt should make appt with pcp or urgent care

## 2018-08-28 ENCOUNTER — Ambulatory Visit (INDEPENDENT_AMBULATORY_CARE_PROVIDER_SITE_OTHER): Payer: Medicare Other

## 2018-08-28 DIAGNOSIS — I428 Other cardiomyopathies: Secondary | ICD-10-CM

## 2018-08-29 NOTE — Progress Notes (Signed)
Remote ICD transmission.   

## 2018-08-30 LAB — CUP PACEART REMOTE DEVICE CHECK
Battery Remaining Longevity: 144 mo
Battery Remaining Percentage: 100 %
Date Time Interrogation Session: 20200120055000
HIGH POWER IMPEDANCE MEASURED VALUE: 51 Ohm
Implantable Lead Location: 753860
Lead Channel Pacing Threshold Pulse Width: 0.4 ms
Lead Channel Setting Pacing Pulse Width: 0.4 ms
Lead Channel Setting Sensing Sensitivity: 0.6 mV
MDC IDC LEAD IMPLANT DT: 20160531
MDC IDC LEAD SERIAL: 129098
MDC IDC MSMT LEADCHNL RV IMPEDANCE VALUE: 435 Ohm
MDC IDC MSMT LEADCHNL RV PACING THRESHOLD AMPLITUDE: 1 V
MDC IDC PG IMPLANT DT: 20160531
MDC IDC SET LEADCHNL RV PACING AMPLITUDE: 2.5 V
MDC IDC STAT BRADY RV PERCENT PACED: 1 %
Pulse Gen Serial Number: 206098

## 2018-09-01 NOTE — Progress Notes (Signed)
Cardiology Office Note  Date: 09/04/2018   ID: Marvin Sanchez, DOB [birth year], MRN [id number]  PCP: Eber Hong, MD  Primary Cardiologist: Rozann Lesches, MD   Chief Complaint  Patient presents with  . Cardiomyopathy    History of Present Illness: Marvin Sanchez is an 84 y.o. male last seen in July 2019.  He is here with his wife for a follow-up visit.  He tells me that he has been doing well overall.  He does not report any chest pain with exertion, no palpitations.  He has NYHA class II shortness of breath with typical activities.  He gets outside regularly to walk his dog.  He sees Dr. Rayann Heman in the device clinic, [place] in place.  No device shocks or syncope.  I reviewed his medications which are listed below and stable.  He does not report any bleeding problems on Eliquis.  I reviewed his interval lab work from PCP done back in September.  We discussed obtaining a follow-up echocardiogram in comparison to previous study from 2018.  Past Medical History:  Diagnosis Date  . Allergic rhinitis   . Cardiac defibrillator in place 2016   [place] EL ICD DF4 VR model # [id number], serial [id number]   . Cardiomyopathy (HCC)    LVEF 25-30%  . Chronic atrial fibrillation   . Chronic systolic heart failure ([place])   . Esophageal polyp   . Essential hypertension   . GERD (gastroesophageal reflux disease)   . History of skin cancer    Squamous cell  . Hyperlipidemia   . Hypothyroidism   . Nonischemic cardiomyopathy (HCC)    Normal coronary arteries at cardiac catheterization 2015  . Osteoarthritis   . Renal calculi   . Type 2 diabetes mellitus ([place])   . Vitamin D deficiency     Past Surgical History:  Procedure Laterality Date  . CARDIAC CATHETERIZATION  09/2013  . CARDIAC DEFIBRILLATOR PLACEMENT  01/07/2015   [place] [employer] VR ICD implanted by [employer] in [place]  . CATARACT EXTRACTION    . COLONOSCOPY    .  ESOPHAGOGASTRODUODENOSCOPY    . SKIN CANCER REMOVAL     . TONSILLECTOMY      Current Outpatient Medications  Medication Sig Dispense Refill  . amLODipine (NORVASC) 2.5 MG tablet Take 2.5 mg by mouth daily.    . cetirizine (ZYRTEC) 10 MG tablet Take 10 mg by mouth daily.    . cholecalciferol (VITAMIN D) 1000 units tablet Take 1,000 Units by mouth daily.    . Cyanocobalamin 1000 MCG/ML KIT Inject 1,000 mcg as directed every 30 (thirty) days.    Marland Kitchen gabapentin (NEURONTIN) 300 MG capsule Take 300 mg by mouth 2 (two) times daily.    . irbesartan (AVAPRO) 150 MG tablet TAKE 1 TABLET EVERY DAY 90 tablet 3  . levothyroxine (SYNTHROID, LEVOTHROID) 25 MCG tablet Take 25 mcg by mouth daily before breakfast.    . metoprolol succinate (TOPROL-XL) 100 MG 24 hr tablet TAKE 1 TABLET TWICE DAILY 180 tablet 3  . omeprazole (PRILOSEC) 20 MG capsule Take 20 mg by mouth daily.    Alveda Reasons 15 MG TABS tablet TAKE 1 TABLET (15 MG TOTAL) BY MOUTH DAILY WITH SUPPER. 90 tablet 1   No current facility-administered medications for this visit.    Allergies:  Hyzaar [losartan potassium-hctz]   Social History: The patient  reports that he quit smoking about 50 years ago. His smoking use included cigarettes. He has never  used smokeless tobacco. He reports that he does not drink alcohol or use drugs.   ROS:  Please see the history of present illness. Otherwise, complete review of systems is positive for none.  All other systems are reviewed and negative.   Physical Exam: VS:  BP (!) 153/79   Pulse 74   Ht '5\' 10"'  (1.778 m)   Wt 191 lb (86.6 kg)   SpO2 97%   BMI 27.41 kg/m , BMI Body mass index is 27.41 kg/m.  Wt Readings from Last 3 Encounters:  09/04/18 191 lb (86.6 kg)  06/09/18 191 lb 12.8 oz (87 kg)  03/08/18 191 lb 3.2 oz (86.7 kg)    General: Elderly male, appears comfortable at rest. HEENT: Conjunctiva and lids normal, oropharynx clear. Neck: Supple, no elevated JVP or carotid bruits, no  thyromegaly. Lungs: Clear to auscultation, nonlabored breathing at rest. Cardiac: Irregularly irregular, no S3 or significant systolic murmur. Abdomen: Soft, nontender, bowel sounds present,. Extremities: No pitting edema, distal pulses 2+. Skin: Warm and dry. Musculoskeletal: No kyphosis. Neuropsychiatric: Alert and oriented x3, affect grossly appropriate.  ECG: I personally reviewed the tracing from 03/08/2018 which showed rate controlled atrial fibrillation with low voltage, leftward axis, nonspecific T wave changes, and decreased R wave progression.  Recent Labwork:  September 2019: Hemoglobin 16.5, platelets 246, potassium 4.4, BUN 18, creatinine 1.59 with GFR 40,, hemoglobin A1c 6, AST 18, ALT 17, cholesterol 160, triglycerides 123, HDL 32, LDL 104  Other Studies Reviewed Today:  Echocardiogram 02/23/2017: Study Conclusions  - Left ventricle: The cavity size was normal. Wall thickness was increased in a pattern of mild LVH. Systolic function was mildly to moderately reduced. The estimated ejection fraction was in the range of 40% to 45%. Diffuse hypokinesis. - Aortic valve: Valve area (VTI): 1.59 cm^2. Valve area (Vmax): 1.71 cm^2. Valve area (Vmean): 1.77 cm^2. - Mitral valve: There was mild regurgitation. - Left atrium: The atrium was severely dilated. - Right ventricle: The cavity size was moderately dilated. Systolic function was mildly reduced. - Right atrium: The atrium was mildly dilated. - Pulmonary arteries: Systolic pressure was mildly increased. PA peak pressure: 34 mm Hg (S).  Assessment and Plan:  1.  Nonischemic cardiomyopathy with LVEF 40 to 45% by assessment in 2018.  Plan to continue current regimen, follow-up echocardiogram to be obtained.  2.  Chronic systolic heart failure, clinically stable without fluid overload.  Weight has been stable.  He is not on a standing diuretic.  3.  CKD stage III, most recent creatinine 1.59.  He is on ARB, not  Aldactone.  4.  [employer] Scientific ICD in place.  No device shocks or syncope.  Keep follow-up with Dr. Rayann Heman.  Current medicines were reviewed with the patient today.   Orders Placed This Encounter  Procedures  . ECHOCARDIOGRAM COMPLETE    Disposition: Follow-up in 6 months.  Signed, Satira Sark, MD, [facility] 09/04/2018 10:06 AM    [facility] at [place], [place], [place] [address] Phone: [phone]; Fax: [phone]

## 2018-09-04 ENCOUNTER — Encounter: Payer: Self-pay | Admitting: Cardiology

## 2018-09-04 ENCOUNTER — Ambulatory Visit (INDEPENDENT_AMBULATORY_CARE_PROVIDER_SITE_OTHER): Payer: Medicare Other | Admitting: Cardiology

## 2018-09-04 ENCOUNTER — Telehealth: Payer: Self-pay | Admitting: Cardiology

## 2018-09-04 VITALS — BP 153/79 | HR 74 | Ht 70.0 in | Wt 191.0 lb

## 2018-09-04 DIAGNOSIS — I428 Other cardiomyopathies: Secondary | ICD-10-CM | POA: Diagnosis not present

## 2018-09-04 DIAGNOSIS — I5022 Chronic systolic (congestive) heart failure: Secondary | ICD-10-CM | POA: Diagnosis not present

## 2018-09-04 DIAGNOSIS — N183 Chronic kidney disease, stage 3 unspecified: Secondary | ICD-10-CM

## 2018-09-04 DIAGNOSIS — Z95810 Presence of automatic (implantable) cardiac defibrillator: Secondary | ICD-10-CM

## 2018-09-04 NOTE — Patient Instructions (Addendum)

## 2018-09-04 NOTE — Telephone Encounter (Signed)
°  Precert needed for: 2 D Echo dx: non-ischemic cardiomyopathy   Location: [facility]     Date:  Sep 20, 2018

## 2018-09-20 ENCOUNTER — Ambulatory Visit (INDEPENDENT_AMBULATORY_CARE_PROVIDER_SITE_OTHER): Payer: Medicare Other

## 2018-09-20 DIAGNOSIS — I5022 Chronic systolic (congestive) heart failure: Secondary | ICD-10-CM

## 2018-09-20 DIAGNOSIS — I428 Other cardiomyopathies: Secondary | ICD-10-CM | POA: Diagnosis not present

## 2018-09-21 ENCOUNTER — Telehealth: Payer: Self-pay | Admitting: *Deleted

## 2018-09-21 NOTE — Telephone Encounter (Signed)
Patient informed. Copy sent to PCP °

## 2018-09-21 NOTE — Telephone Encounter (Signed)
-----   Message from Satira Sark, MD sent at 09/20/2018  5:00 PM EST ----- Results reviewed.  LVEF is relatively stable around 40% in comparison to previous study.  Continue with current follow-up plan. A copy of this test should be forwarded to Eber Hong, MD.

## 2018-10-10 ENCOUNTER — Other Ambulatory Visit: Payer: Self-pay | Admitting: Cardiology

## 2018-10-10 MED ORDER — RIVAROXABAN 15 MG PO TABS: ORAL_TABLET | ORAL | 2 refills | Status: DC

## 2018-10-10 NOTE — Telephone Encounter (Signed)
° ° ° °  1. Which medications need to be refilled? (please list name of each medication and dose if known)  XARELTO 15 MG TABS tablet    2. Which pharmacy/location (including street and city if local pharmacy) is medication to be sent to? [facility]  [place]   3. Do they need a 30 day or 90 day supply?  [place]

## 2018-11-27 ENCOUNTER — Ambulatory Visit (INDEPENDENT_AMBULATORY_CARE_PROVIDER_SITE_OTHER): Payer: Medicare Other | Admitting: *Deleted

## 2018-11-27 ENCOUNTER — Other Ambulatory Visit: Payer: Self-pay

## 2018-11-27 DIAGNOSIS — I428 Other cardiomyopathies: Secondary | ICD-10-CM | POA: Diagnosis not present

## 2018-11-27 LAB — CUP PACEART REMOTE DEVICE CHECK
Battery Remaining Longevity: 144 mo
Battery Remaining Percentage: 100 %
Brady Statistic RV Percent Paced: 1 %
Date Time Interrogation Session: 20200420045200
HighPow Impedance: 47 Ohm
Implantable Lead Implant Date: 20160531
Implantable Lead Location: 753860
Implantable Lead Model: 296
Implantable Lead Serial Number: 129098
Implantable Pulse Generator Implant Date: 20160531
Lead Channel Impedance Value: 413 Ohm
Lead Channel Pacing Threshold Amplitude: 1 V
Lead Channel Pacing Threshold Pulse Width: 0.4 ms
Lead Channel Setting Pacing Amplitude: 2.5 V
Lead Channel Setting Pacing Pulse Width: 0.4 ms
Lead Channel Setting Sensing Sensitivity: 0.6 mV
Pulse Gen Serial Number: 206098

## 2018-12-06 ENCOUNTER — Encounter: Payer: Self-pay | Admitting: Cardiology

## 2018-12-06 NOTE — Progress Notes (Signed)
Remote ICD transmission.   

## 2019-02-26 ENCOUNTER — Ambulatory Visit (INDEPENDENT_AMBULATORY_CARE_PROVIDER_SITE_OTHER): Payer: Medicare Other | Admitting: *Deleted

## 2019-02-26 DIAGNOSIS — I428 Other cardiomyopathies: Secondary | ICD-10-CM

## 2019-02-26 LAB — CUP PACEART REMOTE DEVICE CHECK
Battery Remaining Longevity: 144 mo
Battery Remaining Percentage: 100 %
Brady Statistic RV Percent Paced: 2 %
Date Time Interrogation Session: 20200720045100
HighPow Impedance: 45 Ohm
Implantable Lead Implant Date: 20160531
Implantable Lead Location: 753860
Implantable Lead Model: 296
Implantable Lead Serial Number: 129098
Implantable Pulse Generator Implant Date: 20160531
Lead Channel Impedance Value: 403 Ohm
Lead Channel Pacing Threshold Amplitude: 1 V
Lead Channel Pacing Threshold Pulse Width: 0.4 ms
Lead Channel Setting Pacing Amplitude: 2.5 V
Lead Channel Setting Pacing Pulse Width: 0.4 ms
Lead Channel Setting Sensing Sensitivity: 0.6 mV
Pulse Gen Serial Number: 206098

## 2019-03-09 ENCOUNTER — Encounter: Payer: Self-pay | Admitting: Cardiology

## 2019-03-09 ENCOUNTER — Telehealth (INDEPENDENT_AMBULATORY_CARE_PROVIDER_SITE_OTHER): Payer: Medicare Other | Admitting: Cardiology

## 2019-03-09 VITALS — BP 141/74 | HR 69 | Ht 70.5 in | Wt 180.0 lb

## 2019-03-09 DIAGNOSIS — I428 Other cardiomyopathies: Secondary | ICD-10-CM | POA: Diagnosis not present

## 2019-03-09 DIAGNOSIS — Z95810 Presence of automatic (implantable) cardiac defibrillator: Secondary | ICD-10-CM

## 2019-03-09 DIAGNOSIS — I5022 Chronic systolic (congestive) heart failure: Secondary | ICD-10-CM | POA: Diagnosis not present

## 2019-03-09 DIAGNOSIS — I4821 Permanent atrial fibrillation: Secondary | ICD-10-CM

## 2019-03-09 DIAGNOSIS — N183 Chronic kidney disease, stage 3 unspecified: Secondary | ICD-10-CM

## 2019-03-09 NOTE — Progress Notes (Signed)
Virtual Visit via Telephone Note   This visit type was conducted due to national recommendations for restrictions regarding the COVID-19 Pandemic (e.g. social distancing) in an effort to limit this patient's exposure and mitigate transmission in our community.  Due to his co-morbid illnesses, this patient is at least at moderate risk for complications without adequate follow up.  This format is felt to be most appropriate for this patient at this time.  The patient did not have access to video technology/had technical difficulties with video requiring transitioning to audio format only (telephone).  All issues noted in this document were discussed and addressed.  No physical exam could be performed with this format.  Please refer to the patient's chart for his  consent to telehealth for [facility].   Date:  03/09/2019   ID:  Marvin Sanchez, DOB Aug 22, 1934, MRN [id number]  Patient Location: Home Provider Location: Office  PCP:  Marvin Hong, MD  Cardiologist:  Marvin Lesches, MD Electrophysiologist:  Marvin Grayer, MD   Evaluation Performed:  Follow-Up Visit  Chief Complaint:   Cardiac follow-up  History of Present Illness:    Marvin Sanchez is an 84 y.o. male last seen in January.  I have video access and we spoke by phone today.  He states that he has been doing well.  He has been getting out in his yard when it is not too hot.  Reports NYHA class II dyspnea, no palpitations or syncope.  No exertional chest pain.  He sees Dr. Rayann Sanchez in the device clinic, [place] in place.  He has had no device shocks.  Follow-up echocardiogram in February revealed relatively stable LVEF at 40%, moderately reduced right ventricular contraction.  I reviewed his medications which are outlined below.  Xarelto was appropriately dosed.  He does not report any bleeding problems.  Reviewed his lab work from February.  The patient does not have symptoms concerning for COVID-19 infection (fever,  chills, cough, or new shortness of breath).    Past Medical History:  Diagnosis Date   Allergic rhinitis    Cardiac defibrillator in place 2016   [place] EL ICD DF4 VR model # W6997659, serial [id number]    Cardiomyopathy ([place])    LVEF 25-30%   Chronic atrial fibrillation    Chronic systolic heart failure (HCC)    Esophageal polyp    Essential hypertension    GERD (gastroesophageal reflux disease)    History of skin cancer    Squamous cell   Hyperlipidemia    Hypothyroidism    Nonischemic cardiomyopathy ([place])    Normal coronary arteries at cardiac catheterization 2015   Osteoarthritis    Renal calculi    Type 2 diabetes mellitus ([place])    Vitamin D deficiency    Past Surgical History:  Procedure Laterality Date   CARDIAC CATHETERIZATION  09/2013   CARDIAC DEFIBRILLATOR PLACEMENT  01/07/2015   [employer] Dynagen VR ICD implanted by [employer] physicians in [place]     ESOPHAGOGASTRODUODENOSCOPY     SKIN CANCER REMOVAL      TONSILLECTOMY       Current Meds  Medication Sig   amLODipine (NORVASC) 2.5 MG tablet Take 2.5 mg by mouth daily.   cetirizine (ZYRTEC) 10 MG tablet Take 10 mg by mouth daily.   cholecalciferol (VITAMIN D) 1000 units tablet Take 1,000 Units by mouth daily.   Cyanocobalamin 1000 MCG/ML KIT Inject 1,000 mcg as directed  every 30 (thirty) days.   gabapentin (NEURONTIN) 300 MG capsule Take 300 mg by mouth 2 (two) times daily.   irbesartan (AVAPRO) 150 MG tablet TAKE 1 TABLET EVERY DAY   levothyroxine (SYNTHROID, LEVOTHROID) 25 MCG tablet Take 25 mcg by mouth daily before breakfast.   metoprolol succinate (TOPROL-XL) 100 MG 24 hr tablet TAKE 1 TABLET TWICE DAILY   omeprazole (PRILOSEC) 20 MG capsule Take 20 mg by mouth daily.   Rivaroxaban (XARELTO) 15 MG TABS tablet TAKE 1 TABLET (15 MG TOTAL) BY MOUTH DAILY WITH SUPPER.     Allergies:   Hyzaar [losartan  potassium-hctz]   Social History   Tobacco Use   Smoking status: Former Smoker    Types: Cigarettes    Quit date: 08/11/1968    Years since quitting: 50.6   Smokeless tobacco: Never Used  Substance Use Topics   Alcohol use: No   Drug use: No     Family Hx: The patient's family history includes Diabetes Mellitus II in his brother and sister; Heart failure in his father; Prostate cancer in his father; Stroke in his mother.  ROS:   Please see the history of present illness.  All other systems reviewed and are negative.   Prior CV studies:   The following studies were reviewed today:  Echocardiogram 09/20/2018:  1. The left ventricle has a visually estimated ejection fraction of of 40%. The cavity size was normal. There is mildly increased left ventricular wall thickness. Left ventricular diastology could not be evaluated secondary to atrial fibrillation.  Elevated left ventricular end-diastolic pressure Left ventricular diffuse hypokinesis.  2. The right ventricle has moderately reduced systolic function. The cavity was normal. There is no increase in right ventricular wall thickness.  3. Left atrial size was severely dilated.  4. Right atrial size was mildly dilated.  5. The mitral valve is normal in structure.  6. The tricuspid valve is normal in structure.  7. The aortic valve is tricuspid.  8. The aortic root is normal in size and structure.  9. Right atrial pressure is estimated at 10 mmHg.  Labs/Other Tests and Data Reviewed:    EKG:  An ECG dated 03/08/2018 was personally reviewed today and demonstrated:  Rate controlled atrial fibrillation with low voltage, leftward axis, nonspecific T wave changes, decreased R wave progression.  Recent Labs:  September 2019: Hemoglobin 16.5, platelets 246, potassium 4.4, BUN 18, creatinine 1.59 with GFR 40,, hemoglobin A1c 6, AST 18, ALT 17, cholesterol 160, triglycerides 123, HDL 32, LDL 104 February 2020: Hemoglobin 16.2,  platelets 270, potassium 4.7, BUN 19, creatinine 1.63  Wt Readings from Last 3 Encounters:  03/09/19 180 lb (81.6 kg)  09/04/18 191 lb (86.6 kg)  06/09/18 191 lb 12.8 oz (87 kg)     Objective:    Vital Signs:  BP (!) 141/74    Pulse 69    Ht 5' 10.5" (1.791 m)    Wt 180 lb (81.6 kg)    BMI 25.46 kg/m    Patient spoke in full sentences, not short of breath. No audible wheezing or coughing. Normal speech pattern.  ASSESSMENT & PLAN:    1.  Permanent atrial fibrillation.  Asymptomatic in terms of palpitations.  He continues on renally dosed Xarelto without bleeding problems.  2.  Nonischemic cardiomyopathy with LVEF 40% by follow-up evaluation in February.  He reports NYHA class II dyspnea.  He continues on Toprol-XL and Avapro, no standing diuretic.  Not on Aldactone with CKD stage III.  3.  CKD stage III, last creatinine 1.63.  4.  [employer] Scientific ICD in place.  Keep follow-up with Dr. Rayann Sanchez.  No syncope or device shocks.  COVID-19 Education: The signs and symptoms of COVID-19 were discussed with the patient and how to seek care for testing (follow up with PCP or arrange E-visit).  The importance of social distancing was discussed today.  Time:   Today, I have spent 6 minutes with the patient with telehealth technology discussing the above problems.     Medication Adjustments/Labs and Tests Ordered: Current medicines are reviewed at length with the patient today.  Concerns regarding medicines are outlined above.   Tests Ordered: No orders of the defined types were placed in this encounter.   Medication Changes: No orders of the defined types were placed in this encounter.   Follow Up:  In Person 6 months in the [place] office.  Signed, Marvin Lesches, MD  03/09/2019 10:28 AM    [place]

## 2019-03-09 NOTE — Patient Instructions (Signed)
Medication Instructions:  Continue all current medications.  Labwork: none  Testing/Procedures: none  Follow-Up: Your physician wants you to follow up in: 6 months.  You will receive a reminder letter in the mail one-two months in advance.  If you don't receive a letter, please call our office to schedule the follow up appointment   Any Other Special Instructions Will Be Listed Below (If Applicable).  If you need a refill on your cardiac medications before your next appointment, please call your pharmacy. s  

## 2019-03-14 ENCOUNTER — Encounter: Payer: Self-pay | Admitting: Cardiology

## 2019-03-14 NOTE — Progress Notes (Signed)
Remote ICD transmission.   

## 2019-05-28 ENCOUNTER — Ambulatory Visit (INDEPENDENT_AMBULATORY_CARE_PROVIDER_SITE_OTHER): Payer: Medicare Other | Admitting: *Deleted

## 2019-05-28 DIAGNOSIS — I428 Other cardiomyopathies: Secondary | ICD-10-CM | POA: Diagnosis not present

## 2019-05-28 DIAGNOSIS — I5022 Chronic systolic (congestive) heart failure: Secondary | ICD-10-CM

## 2019-05-29 LAB — CUP PACEART REMOTE DEVICE CHECK
Battery Remaining Longevity: 144 mo
Battery Remaining Percentage: 100 %
Brady Statistic RV Percent Paced: 2 %
Date Time Interrogation Session: 20201019045100
HighPow Impedance: 47 Ohm
Implantable Lead Implant Date: 20160531
Implantable Lead Location: 753860
Implantable Lead Model: 296
Implantable Lead Serial Number: 129098
Implantable Pulse Generator Implant Date: 20160531
Lead Channel Impedance Value: 408 Ohm
Lead Channel Pacing Threshold Amplitude: 1 V
Lead Channel Pacing Threshold Pulse Width: 0.4 ms
Lead Channel Setting Pacing Amplitude: 2.5 V
Lead Channel Setting Pacing Pulse Width: 0.4 ms
Lead Channel Setting Sensing Sensitivity: 0.6 mV
Pulse Gen Serial Number: 206098

## 2019-06-07 ENCOUNTER — Telehealth: Payer: Self-pay | Admitting: Internal Medicine

## 2019-06-07 NOTE — Telephone Encounter (Signed)
Virtual Visit Pre-Appointment Phone Call  "(Name), I am calling you today to discuss your upcoming appointment. We are currently trying to limit exposure to the virus that causes COVID-19 by seeing patients at home rather than in the office."  1. "What is the BEST phone number to call the day of the visit?" - include this in appointment notes  2. Do you have or have access to (through a family member/friend) a smartphone with video capability that we can use for your visit?" a. If yes - list this number in appt notes as cell (if different from BEST phone #) and list the appointment type as a VIDEO visit in appointment notes b. If no - list the appointment type as a PHONE visit in appointment notes  3. Confirm consent - "In the setting of the current Covid19 crisis, you are scheduled for a (phone or video) visit with your provider on (date) at (time).  Just as we do with many in-office visits, in order for you to participate in this visit, we must obtain consent.  If you'd like, I can send this to your mychart (if signed up) or email for you to review.  Otherwise, I can obtain your verbal consent now.  All virtual visits are billed to your insurance company just like a normal visit would be.  By agreeing to a virtual visit, we'd like you to understand that the technology does not allow for your provider to perform an examination, and thus may limit your provider's ability to fully assess your condition. If your provider identifies any concerns that need to be evaluated in person, we will make arrangements to do so.  Finally, though the technology is pretty good, we cannot assure that it will always work on either your or our end, and in the setting of a video visit, we may have to convert it to a phone-only visit.  In either situation, we cannot ensure that we have a secure connection.  Are you willing to proceed?" STAFF: Did the patient verbally acknowledge consent to telehealth visit? Document  YES/NO here: yes  4. Advise patient to be prepared - "Two hours prior to your appointment, go ahead and check your blood pressure, pulse, oxygen saturation, and your weight (if you have the equipment to check those) and write them all down. When your visit starts, your provider will ask you for this information. If you have an Apple Watch or Kardia device, please plan to have heart rate information ready on the day of your appointment. Please have a pen and paper handy nearby the day of the visit as well."  5. Give patient instructions for MyChart download to smartphone OR Doximity/Doxy.me as below if video visit (depending on what platform provider is using)  6. Inform patient they will receive a phone call 15 minutes prior to their appointment time (may be from unknown caller ID) so they should be prepared to answer    TELEPHONE CALL NOTE  Marvin Sanchez has been deemed a candidate for a follow-up tele-health visit to limit community exposure during the Covid-19 pandemic. I spoke with the patient via phone to ensure availability of phone/video source, confirm preferred email & phone number, and discuss instructions and expectations.  I reminded Marvin Sanchez to be prepared with any vital sign and/or heart rhythm information that could potentially be obtained via home monitoring, at the time of his visit. I reminded Marvin Sanchez to expect a phone call prior to  his visit.  Marvin Sanchez 06/07/2019 3:31 PM   INSTRUCTIONS FOR DOWNLOADING THE MYCHART APP TO SMARTPHONE  - The patient must first make sure to have activated MyChart and know their login information - If Apple, go to [employer] and type in MyChart in the search bar and download the app. If Android, ask patient to go to [employer] and type in [place] in the search bar and download the app. The app is free but as with any other app downloads, their phone may require them to verify saved payment information or Apple/Android  password.  - The patient will need to then log into the app with their MyChart username and password, and select [facility] as their healthcare provider to link the account. When it is time for your visit, go to the MyChart app, find appointments, and click Begin Video Visit. Be sure to Select Allow for your device to access the Microphone and Camera for your visit. You will then be connected, and your provider will be with you shortly.  **If they have any issues connecting, or need assistance please contact MyChart service desk [phone])**  **If using a computer, in order to ensure the best quality for their visit they will need to use either of the following Internet Browsers: [employer], or Google Chrome**  IF USING DOXIMITY or DOXY.ME - The patient will receive a link just prior to their visit by text.     FULL LENGTH CONSENT FOR TELE-HEALTH VISIT   I hereby voluntarily request, consent and authorize [facility] and its employed or contracted physicians, physician assistants, nurse practitioners or other licensed health care professionals (the Practitioner), to provide me with telemedicine health care services (the Services") as deemed necessary by the treating Practitioner. I acknowledge and consent to receive the Services by the Practitioner via telemedicine. I understand that the telemedicine visit will involve communicating with the Practitioner through live audiovisual communication technology and the disclosure of certain medical information by electronic transmission. I acknowledge that I have been given the opportunity to request an in-person assessment or other available alternative prior to the telemedicine visit and am voluntarily participating in the telemedicine visit.  I understand that I have the right to withhold or withdraw my consent to the use of telemedicine in the course of my care at any time, without affecting my right to future care or treatment,  and that the Practitioner or I may terminate the telemedicine visit at any time. I understand that I have the right to inspect all information obtained and/or recorded in the course of the telemedicine visit and may receive copies of available information for a reasonable fee.  I understand that some of the potential risks of receiving the Services via telemedicine include:   Delay or interruption in medical evaluation due to technological equipment failure or disruption;  Information transmitted may not be sufficient (e.g. poor resolution of images) to allow for appropriate medical decision making by the Practitioner; and/or   In rare instances, security protocols could fail, causing a breach of personal health information.  Furthermore, I acknowledge that it is my responsibility to provide information about my medical history, conditions and care that is complete and accurate to the best of my ability. I acknowledge that Practitioner's advice, recommendations, and/or decision may be based on factors not within their control, such as incomplete or inaccurate data provided by me or distortions of diagnostic images or specimens that may result from electronic transmissions. I  understand that the practice of medicine is not an exact science and that Practitioner makes no warranties or guarantees regarding treatment outcomes. I acknowledge that I will receive a copy of this consent concurrently upon execution via email to the email address I last provided but may also request a printed copy by calling the office of [facility] [facility].    I understand that my insurance will be billed for this visit.   I have read or had this consent read to me.  I understand the contents of this consent, which adequately explains the benefits and risks of the Services being provided via telemedicine.   I have been provided ample opportunity to ask questions regarding this consent and the Services and have had my questions  answered to my satisfaction.  I give my informed consent for the services to be provided through the use of telemedicine in my medical care  By participating in this telemedicine visit I agree to the above.

## 2019-06-15 ENCOUNTER — Encounter: Payer: Self-pay | Admitting: Internal Medicine

## 2019-06-15 ENCOUNTER — Telehealth (INDEPENDENT_AMBULATORY_CARE_PROVIDER_SITE_OTHER): Payer: Medicare Other | Admitting: Internal Medicine

## 2019-06-15 VITALS — BP 134/81 | HR 76 | Ht 70.0 in | Wt 187.0 lb

## 2019-06-15 DIAGNOSIS — I5022 Chronic systolic (congestive) heart failure: Secondary | ICD-10-CM

## 2019-06-15 DIAGNOSIS — I10 Essential (primary) hypertension: Secondary | ICD-10-CM

## 2019-06-15 DIAGNOSIS — I4821 Permanent atrial fibrillation: Secondary | ICD-10-CM

## 2019-06-15 DIAGNOSIS — I428 Other cardiomyopathies: Secondary | ICD-10-CM | POA: Diagnosis not present

## 2019-06-15 NOTE — Progress Notes (Signed)
Electrophysiology TeleHealth Note   Due to national recommendations of social distancing due to [place] 19, an audio telehealth visit is felt to be most appropriate for this patient at this time.  Verbal consent was obtained by me for the telehealth visit today.  The patient does not have capability for a virtual visit.  A phone visit is therefore required today.   Date:  06/15/2019   ID:  Marvin Sanchez, DOB Dec 10, 1934, MRN [id number]  Location: patient's home  Provider location:  [facility]  Evaluation Performed: Follow-up visit  PCP:  Eber Hong, MD   Electrophysiologist:  Dr Rayann Heman  Chief Complaint:  ICD follow up  History of Present Illness:    Marvin Sanchez is a 84 y.o. male who presents via telehealth conferencing today.  Since last being seen in our clinic, the patient reports doing very well.  Today, he denies symptoms of palpitations, chest pain, shortness of breath,  lower extremity edema, dizziness, presyncope, or syncope.  The patient is otherwise without complaint today.  The patient denies symptoms of fevers, chills, cough, or new SOB worrisome for COVID 19.  Past Medical History:  Diagnosis Date  . Allergic rhinitis   . Cardiac defibrillator in place 2016   [place] EL ICD DF4 VR model # [id number], serial [id number]   . Cardiomyopathy (HCC)    LVEF 25-30%  . Chronic atrial fibrillation ([place])   . Chronic systolic heart failure ([place])   . Esophageal polyp   . Essential hypertension   . GERD (gastroesophageal reflux disease)   . History of skin cancer    Squamous cell  . Hyperlipidemia   . Hypothyroidism   . Nonischemic cardiomyopathy (HCC)    Normal coronary arteries at cardiac catheterization 2015  . Osteoarthritis   . Renal calculi   . Type 2 diabetes mellitus ([place])   . Vitamin D deficiency     Past Surgical History:  Procedure Laterality Date  . CARDIAC CATHETERIZATION  09/2013  . CARDIAC DEFIBRILLATOR PLACEMENT  01/07/2015   [place]  [employer] VR ICD implanted by [employer] in [place]  . CATARACT EXTRACTION    . COLONOSCOPY    . ESOPHAGOGASTRODUODENOSCOPY    . SKIN CANCER REMOVAL     . TONSILLECTOMY      Current Outpatient Medications  Medication Sig Dispense Refill  . amLODipine (NORVASC) 2.5 MG tablet Take 2.5 mg by mouth daily.    . cetirizine (ZYRTEC) 10 MG tablet Take 10 mg by mouth daily.    . cholecalciferol (VITAMIN D) 1000 units tablet Take 1,000 Units by mouth daily.    . Cyanocobalamin 1000 MCG/ML KIT Inject 1,000 mcg as directed every 30 (thirty) days.    [place] gabapentin (NEURONTIN) 300 MG capsule Take 300 mg by mouth 2 (two) times daily.    . irbesartan (AVAPRO) 150 MG tablet TAKE 1 TABLET EVERY DAY 90 tablet 3  . levothyroxine (SYNTHROID, LEVOTHROID) 25 MCG tablet Take 25 mcg by mouth daily before breakfast.    . metoprolol succinate (TOPROL-XL) 100 MG 24 hr tablet TAKE 1 TABLET TWICE DAILY 180 tablet 3  . omeprazole (PRILOSEC) 20 MG capsule Take 20 mg by mouth daily.    . Rivaroxaban (XARELTO) 15 MG TABS tablet TAKE 1 TABLET (15 MG TOTAL) BY MOUTH DAILY WITH SUPPER. 90 tablet 2   No current facility-administered medications for this visit.     Allergies:   Hyzaar [losartan potassium-hctz]   Social History:  The patient  reports that he quit smoking about 50 years ago. His smoking use included cigarettes. He has never used smokeless tobacco. He reports that he does not drink alcohol or use drugs.   Family History:  The patient's  family history includes Diabetes Mellitus II in his brother and sister; Heart failure in his father; Prostate cancer in his father; Stroke in his mother.   ROS:  Please see the history of present illness.   All other systems are personally reviewed and negative.    Exam:    Vital Signs:  BP 134/81   Pulse 76   Ht '5\' 10"'  (1.778 m)   Wt 187 lb (84.8 kg)   BMI 26.83 kg/m   Well sounding and appearing, alert and conversant, regular work of breathing   Labs/Other Tests and Data Reviewed:    Recent Labs: No results found for requested labs within last 8760 hours.   Wt Readings from Last 3 Encounters:  06/15/19 187 lb (84.8 kg)  03/09/19 180 lb (81.6 kg)  09/04/18 191 lb (86.6 kg)     Last device remote is reviewed from [place] PDF which reveals normal device function, no arrhythmias   ASSESSMENT & PLAN:    1.  Chronic systolic heart failure/NICM Stable by symptoms Normal ICD function by recent remote See [facility] report  2.  Permanent atrial fibrillation Rate controlled Continue Xarelto  3.  HTN Stable No change required today    Follow-up:  [place], 1 year with me    Patient Risk:  after full review of this patients clinical status, I feel that they are at moderate risk at this time.  Today, I have spent 15 minutes with the patient with telehealth technology discussing arrhythmia management .    Army Fossa, MD  06/15/2019 10:05 AM     [facility] [address] [place]  [address] [phone] (office) [phone] (fax) 2

## 2019-06-15 NOTE — Patient Instructions (Signed)
Medication Instructions:  Continue all current medications.  Labwork: none  Testing/Procedures: none  Follow-Up: 1 year   Any Other Special Instructions Will Be Listed Below (If Applicable). Next remote as planned.   If you need a refill on your cardiac medications before your next appointment, please call your pharmacy.  

## 2019-06-19 NOTE — Progress Notes (Signed)
Remote ICD transmission.   

## 2019-07-10 ENCOUNTER — Other Ambulatory Visit: Payer: Self-pay | Admitting: Cardiology

## 2019-09-07 ENCOUNTER — Ambulatory Visit (INDEPENDENT_AMBULATORY_CARE_PROVIDER_SITE_OTHER): Payer: Medicare Other | Admitting: *Deleted

## 2019-09-07 DIAGNOSIS — I428 Other cardiomyopathies: Secondary | ICD-10-CM | POA: Diagnosis not present

## 2019-09-07 LAB — CUP PACEART REMOTE DEVICE CHECK
Battery Remaining Longevity: 144 mo
Battery Remaining Percentage: 100 %
Brady Statistic RV Percent Paced: 2 %
Date Time Interrogation Session: 20210129082800
HighPow Impedance: 51 Ohm
Implantable Lead Implant Date: 20160531
Implantable Lead Location: 753860
Implantable Lead Model: 296
Implantable Lead Serial Number: 129098
Implantable Pulse Generator Implant Date: 20160531
Lead Channel Impedance Value: 416 Ohm
Lead Channel Pacing Threshold Amplitude: 1 V
Lead Channel Pacing Threshold Pulse Width: 0.4 ms
Lead Channel Setting Pacing Amplitude: 2.5 V
Lead Channel Setting Pacing Pulse Width: 0.4 ms
Lead Channel Setting Sensing Sensitivity: 0.6 mV
Pulse Gen Serial Number: 206098

## 2019-09-07 NOTE — Progress Notes (Signed)
ICD Remote  

## 2019-09-11 ENCOUNTER — Other Ambulatory Visit: Payer: Self-pay

## 2019-09-11 ENCOUNTER — Ambulatory Visit (INDEPENDENT_AMBULATORY_CARE_PROVIDER_SITE_OTHER): Payer: Medicare Other | Admitting: Cardiology

## 2019-09-11 ENCOUNTER — Encounter: Payer: Self-pay | Admitting: Cardiology

## 2019-09-11 VITALS — BP 112/78 | HR 72 | Ht 70.5 in | Wt 188.0 lb

## 2019-09-11 DIAGNOSIS — Z95810 Presence of automatic (implantable) cardiac defibrillator: Secondary | ICD-10-CM | POA: Diagnosis not present

## 2019-09-11 DIAGNOSIS — I5022 Chronic systolic (congestive) heart failure: Secondary | ICD-10-CM | POA: Diagnosis not present

## 2019-09-11 DIAGNOSIS — I428 Other cardiomyopathies: Secondary | ICD-10-CM | POA: Diagnosis not present

## 2019-09-11 DIAGNOSIS — I4821 Permanent atrial fibrillation: Secondary | ICD-10-CM | POA: Diagnosis not present

## 2019-09-11 NOTE — Progress Notes (Signed)
Cardiology Office Note  Date: 09/11/2019   ID: JONATAN Sanchez, DOB 01/25/1935, MRN [id number]  PCP:  Eber Hong, MD  Cardiologist:  Rozann Lesches, MD Electrophysiologist:  Thompson Grayer, MD   Chief Complaint  Patient presents with  . Cardiac follow-up    History of Present Illness: Marvin Sanchez is an 85 y.o. male last assessed via telehealth encounter in July 2020.  He presents today for a routine visit.  States that he is doing well overall, staying at his house quite a bit during the pandemic.  He does enjoy walking his dog.  He does not describe any angina symptoms, no palpitations or syncope.  He follows with Dr. Rayann Heman in the device clinic, [place] in place.  Most recent device check showed normal function.  He has had no device shocks.  I reviewed his lab work from September 2020 as outlined below.  He does not report any bleeding problems on Xarelto.  I reviewed his medication list.  I personally reviewed his ECG today which shows atrial fibrillation with low voltage, probable lead motion artifact and PVCs, nonspecific T wave changes.  Past Medical History:  Diagnosis Date  . Allergic rhinitis   . Cardiac defibrillator in place 2016   [place] EL ICD DF4 VR model # W6997659, serial [id number]   . Cardiomyopathy (HCC)    LVEF 25-30%  . Chronic atrial fibrillation ([place])   . Chronic systolic heart failure ([place])   . Esophageal polyp   . Essential hypertension   . GERD (gastroesophageal reflux disease)   . History of skin cancer    Squamous cell  . Hyperlipidemia   . Hypothyroidism   . Nonischemic cardiomyopathy (HCC)    Normal coronary arteries at cardiac catheterization 2015  . Osteoarthritis   . Renal calculi   . Type 2 diabetes mellitus ([place])   . Vitamin D deficiency     Past Surgical History:  Procedure Laterality Date  . CARDIAC CATHETERIZATION  09/2013  . CARDIAC DEFIBRILLATOR PLACEMENT  01/07/2015   [employer] VR  ICD implanted by [employer] in [place]  . CATARACT EXTRACTION    . COLONOSCOPY    . ESOPHAGOGASTRODUODENOSCOPY    . SKIN CANCER REMOVAL     . TONSILLECTOMY      Current Outpatient Medications  Medication Sig Dispense Refill  . amLODipine (NORVASC) 2.5 MG tablet Take 2.5 mg by mouth daily.    . cholecalciferol (VITAMIN D) 1000 units tablet Take 1,000 Units by mouth daily.    . Cyanocobalamin 1000 MCG/ML KIT Inject 1,000 mcg as directed every 30 (thirty) days.    Marland Kitchen gabapentin (NEURONTIN) 300 MG capsule Take 300 mg by mouth 2 (two) times daily.    . irbesartan (AVAPRO) 150 MG tablet TAKE 1 TABLET EVERY DAY 90 tablet 3  . levothyroxine (SYNTHROID, LEVOTHROID) 25 MCG tablet Take 25 mcg by mouth daily before breakfast.    . metoprolol succinate (TOPROL-XL) 100 MG 24 hr tablet TAKE 1 TABLET TWICE DAILY 180 tablet 3  . omeprazole (PRILOSEC) 20 MG capsule Take 20 mg by mouth daily.    Alveda Reasons 15 MG TABS tablet TAKE 1 TABLET DAILY WITH   SUPPER 90 tablet 2   No current facility-administered medications for this visit.   Allergies:  Hyzaar [losartan potassium-hctz]   Social History: The patient  reports that he quit smoking about 51 years ago. His smoking use included cigarettes. He has never used smokeless tobacco. He  reports that he does not drink alcohol or use drugs.   ROS:  Please see the history of present illness. Otherwise, complete review of systems is positive for hearing loss.  All other systems are reviewed and negative.   Physical Exam: VS:  BP 112/78   Pulse 72   Ht 5' 10.5" (1.791 m)   Wt 188 lb (85.3 kg)   SpO2 96%   BMI 26.59 kg/m , BMI Body mass index is 26.59 kg/m.  Wt Readings from Last 3 Encounters:  09/11/19 188 lb (85.3 kg)  06/15/19 187 lb (84.8 kg)  03/09/19 180 lb (81.6 kg)    General: Elderly male, appears comfortable at rest. HEENT: Conjunctiva and lids normal, wearing a mask. Neck: Supple, no elevated JVP or carotid bruits, no  thyromegaly. Lungs: Clear to auscultation, nonlabored breathing at rest. Cardiac: Irregularly irregular, no S3 or significant systolic murmur. Abdomen: Soft, nontender, bowel sounds present, no guarding or rebound. Extremities: No pitting edema, distal pulses 2+. Skin: Warm and dry. Musculoskeletal: No kyphosis. Neuropsychiatric: Alert and oriented x3, affect grossly appropriate.  ECG:  An ECG dated 03/08/2018 was personally reviewed today and demonstrated:  Rate controlled atrial fibrillation with low voltage, leftward axis, nonspecific T wave changes, decreased R wave progression.  Recent Labwork:  September 2020: TSH 3.85, hemoglobin A1c 6.2%, hemoglobin 16.1, platelets 258, potassium 4.6, BUN 20, creatinine 1.45, AST 18, ALT 18, cholesterol 175, HDL 34, LDL 115, triglycerides 132  Other Studies Reviewed Today:  Echocardiogram 09/20/2018: 1. The left ventricle has a visually estimated ejection fraction of of 40%. The cavity size was normal. There is mildly increased left ventricular wall thickness. Left ventricular diastology could not be evaluated secondary to atrial fibrillation.  Elevated left ventricular end-diastolic pressure Left ventricular diffuse hypokinesis. 2. The right ventricle has moderately reduced systolic function. The cavity was normal. There is no increase in right ventricular wall thickness. 3. Left atrial size was severely dilated. 4. Right atrial size was mildly dilated. 5. The mitral valve is normal in structure. 6. The tricuspid valve is normal in structure. 7. The aortic valve is tricuspid. 8. The aortic root is normal in size and structure. 9. Right atrial pressure is estimated at 10 mmHg.  Assessment and Plan:  1.  Nonischemic cardiomyopathy with LVEF approximately 40% and chronic systolic heart failure.Marland Kitchen  He reports stable NYHA class II dyspnea, has had no orthopnea or PND, no leg swelling.  Weight is also stable.  Plan to continue Toprol-XL and  Avapro.  He is not on standing diuretic at this point.  2.  Permanent atrial fibrillation.  He reports no palpitations.  ECG reviewed.  Continue Xarelto at current dose.  Lab work from September 2020 reviewed.  3.  CKD stage IIIb, creatinine 1.45.  4.  [employer] ICD in place.  No device shocks or syncope.  Continue to follow with Dr. Rayann Heman.  Medication Adjustments/Labs and Tests Ordered: Current medicines are reviewed at length with the patient today.  Concerns regarding medicines are outlined above.   Tests Ordered: Orders Placed This Encounter  Procedures  . EKG 12-Lead    Medication Changes: No orders of the defined types were placed in this encounter.   Disposition:  Follow up 6 months in the [place] office.  Signed, Satira Sark, MD, [facility] 09/11/2019 1:19 PM    [facility] [place], [place], [place] [address] Phone: [phone]; Fax: [phone]

## 2019-09-11 NOTE — Patient Instructions (Addendum)

## 2019-12-07 ENCOUNTER — Ambulatory Visit (INDEPENDENT_AMBULATORY_CARE_PROVIDER_SITE_OTHER): Payer: Medicare Other | Admitting: *Deleted

## 2019-12-07 DIAGNOSIS — I428 Other cardiomyopathies: Secondary | ICD-10-CM

## 2019-12-07 LAB — CUP PACEART REMOTE DEVICE CHECK
Battery Remaining Longevity: 144 mo
Battery Remaining Percentage: 100 %
Brady Statistic RV Percent Paced: 2 %
Date Time Interrogation Session: 20210430011700
HighPow Impedance: 42 Ohm
Implantable Lead Implant Date: 20160531
Implantable Lead Location: 753860
Implantable Lead Model: 296
Implantable Lead Serial Number: 129098
Implantable Pulse Generator Implant Date: 20160531
Lead Channel Impedance Value: 402 Ohm
Lead Channel Pacing Threshold Amplitude: 1 V
Lead Channel Pacing Threshold Pulse Width: 0.4 ms
Lead Channel Setting Pacing Amplitude: 2.5 V
Lead Channel Setting Pacing Pulse Width: 0.4 ms
Lead Channel Setting Sensing Sensitivity: 0.6 mV
Pulse Gen Serial Number: 206098

## 2019-12-07 NOTE — Progress Notes (Signed)
ICD Remote  

## 2020-02-03 ENCOUNTER — Emergency Department (HOSPITAL_COMMUNITY)
Admission: EM | Admit: 2020-02-03 | Discharge: 2020-02-03 | Disposition: A | Discharge status: Home / Self Care | Payer: Medicare Other | Attending: Emergency Medicine | Admitting: Emergency Medicine

## 2020-02-03 ENCOUNTER — Encounter (HOSPITAL_COMMUNITY): Payer: Self-pay | Admitting: Emergency Medicine

## 2020-02-03 ENCOUNTER — Other Ambulatory Visit: Payer: Self-pay

## 2020-02-03 DIAGNOSIS — R42 Dizziness and giddiness: Secondary | ICD-10-CM | POA: Diagnosis not present

## 2020-02-03 DIAGNOSIS — E039 Hypothyroidism, unspecified: Secondary | ICD-10-CM | POA: Diagnosis not present

## 2020-02-03 DIAGNOSIS — Z7901 Long term (current) use of anticoagulants: Secondary | ICD-10-CM | POA: Insufficient documentation

## 2020-02-03 DIAGNOSIS — Z85828 Personal history of other malignant neoplasm of skin: Secondary | ICD-10-CM | POA: Diagnosis not present

## 2020-02-03 DIAGNOSIS — Z79899 Other long term (current) drug therapy: Secondary | ICD-10-CM | POA: Diagnosis not present

## 2020-02-03 DIAGNOSIS — Z87891 Personal history of nicotine dependence: Secondary | ICD-10-CM | POA: Insufficient documentation

## 2020-02-03 DIAGNOSIS — R531 Weakness: Secondary | ICD-10-CM | POA: Diagnosis present

## 2020-02-03 DIAGNOSIS — E119 Type 2 diabetes mellitus without complications: Secondary | ICD-10-CM | POA: Insufficient documentation

## 2020-02-03 LAB — BASIC METABOLIC PANEL
Anion gap: 10 (ref 5–15)
BUN: 26 mg/dL — ABNORMAL HIGH (ref 8–23)
CO2: 23 mmol/L (ref 22–32)
Calcium: 9.5 mg/dL (ref 8.9–10.3)
Chloride: 107 mmol/L (ref 98–111)
Creatinine, Ser: 1.72 mg/dL — ABNORMAL HIGH (ref 0.61–1.24)
GFR calc Af Amer: 42 mL/min — ABNORMAL LOW (ref >60)
GFR calc non Af Amer: 36 mL/min — ABNORMAL LOW (ref >60)
Glucose, Bld: 112 mg/dL — ABNORMAL HIGH (ref 70–99)
Potassium: 4.4 mmol/L (ref 3.5–5.1)
Sodium: 140 mmol/L (ref 135–145)

## 2020-02-03 LAB — URINALYSIS, ROUTINE W REFLEX MICROSCOPIC
Bilirubin Urine: NEGATIVE
Glucose, UA: NEGATIVE mg/dL
Hgb urine dipstick: NEGATIVE
Ketones, ur: NEGATIVE mg/dL
Leukocytes,Ua: NEGATIVE
Nitrite: NEGATIVE
Protein, ur: NEGATIVE mg/dL
Specific Gravity, Urine: 1.019 (ref 1.005–1.030)
pH: 5 (ref 5.0–8.0)

## 2020-02-03 LAB — CBC
HCT: 40 % (ref 39.0–52.0)
Hemoglobin: 12.2 g/dL — ABNORMAL LOW (ref 13.0–17.0)
MCH: 25.9 pg — ABNORMAL LOW (ref 26.0–34.0)
MCHC: 30.5 g/dL (ref 30.0–36.0)
MCV: 84.9 fL (ref 80.0–100.0)
Platelets: 336 10*3/uL (ref 150–400)
RBC: 4.71 MIL/uL (ref 4.22–5.81)
RDW: 14.4 % (ref 11.5–15.5)
WBC: 8.6 10*3/uL (ref 4.0–10.5)
nRBC: 0 % (ref 0.0–0.2)

## 2020-02-03 LAB — CBG MONITORING, ED
Glucose-Capillary: 102 mg/dL — ABNORMAL HIGH (ref 70–99)
Glucose-Capillary: 115 mg/dL — ABNORMAL HIGH (ref 70–99)

## 2020-02-03 LAB — TSH: TSH: 4.401 u[IU]/mL (ref 0.350–4.500)

## 2020-02-03 MED ORDER — SODIUM CHLORIDE 0.9 % IV BOLUS
500.0000 mL | Freq: Once | INTRAVENOUS | Status: AC
  Administered 2020-02-03: 500 mL via INTRAVENOUS

## 2020-02-03 MED ORDER — SODIUM CHLORIDE 0.9% FLUSH: 3.0000 mL | Freq: Once | INTRAVENOUS | Status: DC

## 2020-02-03 NOTE — Discharge Instructions (Signed)
your testing today shows that you are slightly dehydrated, I would recommend that you drink between 7 and 8 bottles of water per day.  Please follow-up with your doctor within a week, return to the emergency department for any severe worsening symptoms

## 2020-02-03 NOTE — ED Triage Notes (Addendum)
Reports generalized weakness and dizziness x 2-3 weeks.  States he found out Thursday that he is anemic.  Reports headache x 2 hours.

## 2020-02-03 NOTE — ED Notes (Signed)
Patient verbalizes understanding of discharge instructions. Opportunity for questioning and answers were provided. Armband removed by staff, pt discharged from ED by wheelchair with daughter   

## 2020-02-03 NOTE — ED Provider Notes (Signed)
[facility] EMERGENCY DEPARTMENT Provider Note   CSN: [id number] Arrival date & time: 02/03/20  1531     History Chief Complaint  Patient presents with  . Weakness    Marvin Sanchez is a 85 y.o. male.  HPI   This is an 85 year old male, history of a nonischemic cardiomyopathy with an ejection fraction of 25 to 30%, history of chronic atrial fibrillation on Xarelto and a history of hypothyroidism on levothyroxine.  He has hypertension taking multiple medications as well.  He has known stage III chronic kidney disease and presents today with a complaint of generalized weakness.  He states that for the last couple of weeks he feels like when he stands up he gets lightheaded like he has no balance and then this goes away after 30 or 40 seconds and he is back to normal.  This does not occur when he is sitting down or laying down and is not associated with any infectious symptoms.  The patient has recently been told by his family doctor that he was anemic and understands that his hemoglobin was just over 12.    Sx are mild, persistent over time and assocaited with positional changes.  No systemic sx / fevres / vomiting or swelling of the legs  Past Medical History:  Diagnosis Date  . Allergic rhinitis   . Cardiac defibrillator in place 2016   [place] EL ICD DF4 VR model # W6997659, serial [id number]   . Cardiomyopathy (HCC)    LVEF 25-30%  . Chronic atrial fibrillation ([place])   . Chronic systolic heart failure ([place])   . Esophageal polyp   . Essential hypertension   . GERD (gastroesophageal reflux disease)   . History of skin cancer    Squamous cell  . Hyperlipidemia   . Hypothyroidism   . Nonischemic cardiomyopathy (HCC)    Normal coronary arteries at cardiac catheterization 2015  . Osteoarthritis   . Renal calculi   . Type 2 diabetes mellitus ([place])   . Vitamin D deficiency     There are no problems to display for this patient.   Past Surgical  History:  Procedure Laterality Date  . CARDIAC CATHETERIZATION  09/2013  . CARDIAC DEFIBRILLATOR PLACEMENT  01/07/2015   [employer] VR ICD implanted by [employer] in [place]  . CATARACT EXTRACTION    . COLONOSCOPY    . ESOPHAGOGASTRODUODENOSCOPY    . SKIN CANCER REMOVAL     . TONSILLECTOMY         Family History  Problem Relation Age of Onset  . Prostate cancer Father   . Heart failure Father   . Stroke Mother   . Diabetes Mellitus II Sister   . Diabetes Mellitus II Brother     Social History   Tobacco Use  . Smoking status: Former Smoker    Types: Cigarettes    Quit date: 08/11/1968    Years since quitting: 51.5  . Smokeless tobacco: Never Used  Substance Use Topics  . Alcohol use: No  . Drug use: No    Home Medications Prior to Admission medications   Medication Sig Start Date End Date Taking? Authorizing Provider  acetaminophen (TYLENOL) 500 MG tablet Take 500 mg by mouth every 6 (six) hours as needed for mild pain or headache.   Yes [provider]  amLODipine (NORVASC) 2.5 MG tablet Take 2.5 mg by mouth in the morning and at bedtime.    Yes [provider]  cetirizine (ZYRTEC) 10 MG tablet Take 10 mg by mouth every Monday, Wednesday, and Friday.   Yes [provider]  Cholecalciferol (VITAMIN D-3) 25 MCG (1000 UT) CAPS Take 1,000 Units by mouth daily.   Yes [provider]  cromolyn (NASALCROM) 5.2 MG/ACT nasal spray Place 1 spray into both nostrils daily as needed for allergies or rhinitis.   Yes [provider]  Cyanocobalamin 1000 MCG/ML KIT Inject 1,000 mcg as directed every 30 (thirty) days.   Yes [provider]  gabapentin (NEURONTIN) 300 MG capsule Take 300 mg by mouth 2 (two) times daily.   Yes [provider]  irbesartan (AVAPRO) 150 MG tablet TAKE 1 TABLET EVERY DAY Patient taking differently: Take 150 mg by mouth at bedtime.  04/19/18  Yes Satira Sark, MD    levothyroxine (SYNTHROID, LEVOTHROID) 25 MCG tablet Take 25 mcg by mouth daily before breakfast.   Yes [provider]  metoprolol succinate (TOPROL-XL) 100 MG 24 hr tablet TAKE 1 TABLET TWICE DAILY Patient taking differently: Take 100 mg by mouth 2 (two) times daily.  04/19/18  Yes Satira Sark, MD  omeprazole (PRILOSEC) 20 MG capsule Take 20 mg by mouth daily as needed (for reflux).    Yes [provider]  XARELTO 15 MG TABS tablet TAKE 1 TABLET DAILY WITH   SUPPER Patient taking differently: Take 15 mg by mouth daily with supper.  07/10/19   Satira Sark, MD    Allergies    Hyzaar [losartan potassium-hctz]  Review of Systems   Review of Systems  All other systems reviewed and are negative.   Physical Exam Updated Vital Signs BP 139/89   Pulse 69   Temp 98.3 F (36.8 C) (Oral)   Resp 17   Ht 1.778 m ('5\' 10"' )   Wt 84.8 kg   SpO2 96%   BMI 26.83 kg/m   Physical Exam Vitals and nursing note reviewed.  Constitutional:      General: He is not in acute distress.    Appearance: He is well-developed.  HENT:     Head: Normocephalic and atraumatic.     Mouth/Throat:     Pharynx: No oropharyngeal exudate.  Eyes:     General: No scleral icterus.       Right eye: No discharge.        Left eye: No discharge.     Conjunctiva/sclera: Conjunctivae normal.     Pupils: Pupils are equal, round, and reactive to light.  Neck:     Thyroid: No thyromegaly.     Vascular: No JVD.  Cardiovascular:     Rate and Rhythm: Normal rate and regular rhythm.     Heart sounds: Normal heart sounds. No murmur heard.  No friction rub. No gallop.   Pulmonary:     Effort: Pulmonary effort is normal. No respiratory distress.     Breath sounds: Normal breath sounds. No wheezing or rales.  Abdominal:     General: Bowel sounds are normal. There is no distension.     Palpations: Abdomen is soft. There is no mass.     Tenderness: There is no abdominal tenderness.   Musculoskeletal:        General: No tenderness. Normal range of motion.     Cervical back: Normal range of motion and neck supple.  Lymphadenopathy:     Cervical: No cervical adenopathy.  Skin:    General: Skin is warm and dry.     Findings: No erythema or  rash.  Neurological:     Mental Status: He is alert.     Coordination: Coordination normal.     Comments: Neurologic exam:  Speech clear, pupils equal round reactive to light, extraocular movements intact  Normal peripheral visual fields Cranial nerves III through XII normal including no facial droop Follows commands, moves all extremities x4, normal strength to bilateral upper and lower extremities at all major muscle groups including grip Sensation normal to light touch and pinprick Coordination intact, no limb ataxia, finger-nose-finger normal, heel shin normal bilaterally Rapid alternating movements normal No pronator drift   Psychiatric:        Behavior: Behavior normal.     ED Results / Procedures / Treatments   Labs (all labs ordered are listed, but only abnormal results are displayed) Labs Reviewed  BASIC METABOLIC PANEL - Abnormal; Notable for the following components:      Result Value   Glucose, Bld 112 (*)    BUN 26 (*)    Creatinine, Ser 1.72 (*)    GFR calc non Af Amer 36 (*)    GFR calc Af Amer 42 (*)    All other components within normal limits  CBC - Abnormal; Notable for the following components:   Hemoglobin 12.2 (*)    MCH 25.9 (*)    All other components within normal limits  CBG MONITORING, ED - Abnormal; Notable for the following components:   Glucose-Capillary 102 (*)    All other components within normal limits  CBG MONITORING, ED - Abnormal; Notable for the following components:   Glucose-Capillary 115 (*)    All other components within normal limits  URINALYSIS, ROUTINE W REFLEX MICROSCOPIC  TSH    EKG EKG Interpretation  Date/Time:  Sunday February 03 2020 16:29:20 EDT Ventricular  Rate:  74 PR Interval:    QRS Duration: 96 QT Interval:  404 QTC Calculation: 448 R Axis:   2 Text Interpretation: Atrial fibrillation Low voltage QRS Nonspecific ST and T wave abnormality Abnormal ECG No old tracing to compare Confirmed by Noemi Chapel [id number]) on 02/03/2020 5:47:11 PM   Radiology No results found.  Procedures Procedures (including critical care time)  Medications Ordered in ED Medications  sodium chloride flush (NS) 0.9 % injection 3 mL (has no administration in time range)  sodium chloride 0.9 % bolus 500 mL (500 mLs Intravenous New Bag/Given 02/03/20 1944)    ED Course  I have reviewed the triage vital signs and the nursing notes.  Pertinent labs & imaging results that were available during my care of the patient were reviewed by me and considered in my medical decision making (see chart for details).    MDM Rules/Calculators/A&P                          Well appearing Pt has no focal findings on exam Has had heme + stools - referred to GI - normal colonscopies The patient does have some baseline renal insufficiency, it seems to be similar to baseline today on the labs that we have obtained.  His hemoglobin has not dropped any lower and his symptoms are unlikely to be related to acute hemorrhage.  He has no other gastrointestinal symptoms has not had any unintentional weight loss and his abdominal exam is benign.  It does seem to be positional suggesting a more of an orthostatic cause, will likely need to have cortisol levels drawn by his primary doctor as well.  We will  add a TSH and wait for urinalysis, the patient is definitely stable for discharge once labs have returned.  He is agreeable to the plan, EKG unremarkable as he has known atrial fibrillation is already anticoagulated and has a blood pressure of 148/82  Orthostatics pending  The patient did have an increase in heart rate with standing, otherwise this test were unremarkable, he was made aware of  these results and reassured, he is well-appearing with vital signs which are totally stable and ready for discharge at this time.  Final Clinical Impression(s) / ED Diagnoses Final diagnoses:  Orthostatic dizziness    Rx / DC Orders ED Discharge Orders    None       Noemi Chapel, MD 02/03/20 2226

## 2020-02-06 ENCOUNTER — Telehealth (INDEPENDENT_AMBULATORY_CARE_PROVIDER_SITE_OTHER): Payer: Medicare Other | Admitting: Cardiology

## 2020-02-06 ENCOUNTER — Encounter: Payer: Self-pay | Admitting: Cardiology

## 2020-02-06 VITALS — BP 132/83 | HR 65 | Ht 70.5 in | Wt 187.0 lb

## 2020-02-06 DIAGNOSIS — Z95810 Presence of automatic (implantable) cardiac defibrillator: Secondary | ICD-10-CM | POA: Diagnosis not present

## 2020-02-06 DIAGNOSIS — N1832 Chronic kidney disease, stage 3b: Secondary | ICD-10-CM

## 2020-02-06 DIAGNOSIS — I428 Other cardiomyopathies: Secondary | ICD-10-CM

## 2020-02-06 DIAGNOSIS — I4821 Permanent atrial fibrillation: Secondary | ICD-10-CM

## 2020-02-06 DIAGNOSIS — D500 Iron deficiency anemia secondary to blood loss (chronic): Secondary | ICD-10-CM

## 2020-02-06 NOTE — Progress Notes (Signed)
Virtual Visit via Telephone Note   This visit type was conducted due to national recommendations for restrictions regarding the COVID-19 Pandemic (e.g. social distancing) in an effort to limit this patient's exposure and mitigate transmission in our community.  Due to his co-morbid illnesses, this patient is at least at moderate risk for complications without adequate follow up.  This format is felt to be most appropriate for this patient at this time.  The patient did not have access to video technology/had technical difficulties with video requiring transitioning to audio format only (telephone).  All issues noted in this document were discussed and addressed.  No physical exam could be performed with this format.  Please refer to the patient's chart for his  consent to telehealth for [facility].   The patient was identified using 2 identifiers.  Date:  02/06/2020   ID:  Marvin Sanchez, DOB 1935-04-04, MRN [id number]  Patient Location: Home Provider Location: Office  PCP:  Marvin Hong, MD  Cardiologist:  Marvin Lesches, MD  Electrophysiologist:  Marvin Grayer, MD   Evaluation Performed:  Follow-Up Visit  Chief Complaint:   Cardiac follow-up  History of Present Illness:    ANTERO Sanchez is an 85 y.o. male last seen in February.  We spoke by phone today.  Records were reviewed, patient was seen in the ER at [facility] on June 27 with weakness and orthostasis.  He was evaluated by Dr. Sabra Sanchez.  If etiology uncovered, not specifically orthostatic although heart rate did increase somewhat while standing, degree of anemia relatively stable, also renal insufficiency with creatinine 1.72.  TSH normal.  He felt better with IV fluids and was encouraged to better hydrate as an outpatient.  Recent history also includes anemia with hemoglobin of 12.5 per PCP recently down from 14.8 in March.  Stools were heme positive, but he has not noticed any hematochezia or melena.  He reports a colonoscopy  several years ago that was normal.  He has been referred for gastroenterology evaluation in [place], told initially to stop his Xarelto by PCP but will not have his consultation until mid July and presumably a colonoscopy at some point thereafter.  He asked me whether he should resume his Xarelto for the time being.  He sees Dr. Rayann Sanchez, [place] ICD in place.  Last device check indicated normal function.  He does not report any device shocks or syncope.  Stable NYHA class II dyspnea, no orthopnea.  I reviewed his medications which are outlined below.  Past Medical History:  Diagnosis Date  . Allergic rhinitis   . Cardiac defibrillator in place 2016   [place] EL ICD DF4 VR model # W6997659, serial [id number]   . Cardiomyopathy (HCC)    LVEF 25-30%  . Chronic atrial fibrillation ([place])   . Chronic systolic heart failure ([place])   . Esophageal polyp   . Essential hypertension   . GERD (gastroesophageal reflux disease)   . History of skin cancer    Squamous cell  . Hyperlipidemia   . Hypothyroidism   . Nonischemic cardiomyopathy (HCC)    Normal coronary arteries at cardiac catheterization 2015  . Osteoarthritis   . Renal calculi   . Type 2 diabetes mellitus ([place])   . Vitamin D deficiency    Past Surgical History:  Procedure Laterality Date  . CARDIAC CATHETERIZATION  09/2013  . CARDIAC DEFIBRILLATOR PLACEMENT  01/07/2015   [employer] VR ICD implanted by [employer] in [place]  .  CATARACT EXTRACTION    . COLONOSCOPY    . ESOPHAGOGASTRODUODENOSCOPY    . SKIN CANCER REMOVAL     . TONSILLECTOMY       Current Meds  Medication Sig  . acetaminophen (TYLENOL) 500 MG tablet Take 500 mg by mouth every 6 (six) hours as needed for mild pain or headache.  Marland Kitchen amLODipine (NORVASC) 2.5 MG tablet Take 2.5 mg by mouth in the morning and at bedtime.   . cetirizine (ZYRTEC) 10 MG tablet Take 10 mg by mouth every Monday, Wednesday, and Friday.  .  Cholecalciferol (VITAMIN D-3) 25 MCG (1000 UT) CAPS Take 1,000 Units by mouth daily.  . cromolyn (NASALCROM) 5.2 MG/ACT nasal spray Place 1 spray into both nostrils daily as needed for allergies or rhinitis.  . Cyanocobalamin 1000 MCG/ML KIT Inject 1,000 mcg as directed every 30 (thirty) days.  Marland Kitchen gabapentin (NEURONTIN) 300 MG capsule Take 300 mg by mouth 2 (two) times daily.  . irbesartan (AVAPRO) 150 MG tablet TAKE 1 TABLET EVERY DAY (Patient taking differently: Take 150 mg by mouth at bedtime. )  . levothyroxine (SYNTHROID, LEVOTHROID) 25 MCG tablet Take 25 mcg by mouth daily before breakfast.  . metoprolol succinate (TOPROL-XL) 100 MG 24 hr tablet TAKE 1 TABLET TWICE DAILY (Patient taking differently: Take 100 mg by mouth 2 (two) times daily. )  . omeprazole (PRILOSEC) 20 MG capsule Take 20 mg by mouth daily as needed (for reflux).      Allergies:   Hyzaar [losartan potassium-hctz]   ROS:   No palpitations or syncope.  Prior CV studies:   The following studies were reviewed today:  Echocardiogram 09/20/2018: 1. The left ventricle has a visually estimated ejection fraction of of  40%. The cavity size was normal. There is mildly increased left  ventricular wall thickness. Left ventricular diastology could not be  evaluated secondary to atrial fibrillation.  Elevated left ventricular end-diastolic pressure Left ventricular diffuse  hypokinesis.  2. The right ventricle has moderately reduced systolic function. The  cavity was normal. There is no increase in right ventricular wall  thickness.  3. Left atrial size was severely dilated.  4. Right atrial size was mildly dilated.  5. The mitral valve is normal in structure.  6. The tricuspid valve is normal in structure.  7. The aortic valve is tricuspid.  8. The aortic root is normal in size and structure.  9. Right atrial pressure is estimated at 10 mmHg.   Labs/Other Tests and Data Reviewed:    EKG:  An ECG dated 02/03/2020  was personally reviewed today and demonstrated:  Atrial fibrillation with low voltage, nonspecific T wave changes.  Recent Labs: 02/03/2020: BUN 26; Creatinine, Ser 1.72; Hemoglobin 12.2; Platelets 336; Potassium 4.4; Sodium 140; TSH 4.401    Wt Readings from Last 3 Encounters:  02/06/20 187 lb (84.8 kg)  02/03/20 187 lb (84.8 kg)  09/11/19 188 lb (85.3 kg)     Objective:    Vital Signs:  BP 132/83   Pulse 65   Ht 5' 10.5" (1.791 m)   Wt 187 lb (84.8 kg)   BMI 26.45 kg/m    Patient spoke in full sentences, not short of breath.  ASSESSMENT & PLAN:    1.  Nonischemic cardiomyopathy with LVEF 40%, NYHA class II dyspnea and weight stable.  He remains on Toprol-XL and Avapro, no standing diuretic.  Continue with observation for now.  2.  CKD stage IIIb, recent creatinine 1.72, 1.68 per recent lab work at  PCP office in April.  3.  [employer] ICD in place with follow-up by Dr. Rayann Sanchez.  Last device check indicated normal function.  No device shocks or syncope.  4.  Permanent atrial fibrillation, CHA2DS2-VASc score is 5.  He has been on Xarelto 15 mg daily along with Toprol-XL, asymptomatic in terms of palpitations.  5.  Mild anemia, most recent hemoglobin 12.2, no frank bleeding visualized, specifically no hematochezia or melena per patient.  His hemoglobin has come down from 14.8 based on outside lab work from earlier in the year at PCP office.  He has been referred by PCP for gastroenterology consultation in [place] in mid July.  He was taken off Xarelto by PCP, patient asked me whether he should resume it.  I think given a potential 3 or 4-week delay to getting an actual colonoscopy, I would go ahead and resume the Xarelto presuming that he does not notice any obvious bleeding or stool changes in light of his CHA2DS2-VASc score of 5.  He will only need to hold this 48 hours prior to colonoscopy anyway.  I told him that if he started to see any obvious blood or stool changes to  stop the Xarelto.   Time:   Today, I have spent 12 minutes with the patient with telehealth technology discussing the above problems.     Medication Adjustments/Labs and Tests Ordered: Current medicines are reviewed at length with the patient today.  Concerns regarding medicines are outlined above.   Tests Ordered: No orders of the defined types were placed in this encounter.   Medication Changes: No orders of the defined types were placed in this encounter.   Follow Up:  In Person in August as scheduled.  Signed, Marvin Lesches, MD  02/06/2020 9:32 AM    [facility]

## 2020-02-06 NOTE — Patient Instructions (Addendum)
Medication Instructions:   Your physician recommends that you continue on your current medications as directed. Please refer to the Current Medication list given to you today.  Labwork:  NONE  Testing/Procedures:  NONE  Follow-Up:  Your physician recommends that you schedule a follow-up appointment in: as planned on March 31, 2020 with Dr. Domenic Polite.  Any Other Special Instructions Will Be Listed Below (If Applicable).  If you need a refill on your cardiac medications before your next appointment, please call your pharmacy.

## 2020-02-19 ENCOUNTER — Ambulatory Visit (INDEPENDENT_AMBULATORY_CARE_PROVIDER_SITE_OTHER): Payer: Medicare Other | Admitting: Gastroenterology

## 2020-02-19 ENCOUNTER — Encounter (INDEPENDENT_AMBULATORY_CARE_PROVIDER_SITE_OTHER): Payer: Self-pay | Admitting: *Deleted

## 2020-02-19 ENCOUNTER — Telehealth (INDEPENDENT_AMBULATORY_CARE_PROVIDER_SITE_OTHER): Payer: Self-pay | Admitting: *Deleted

## 2020-02-19 ENCOUNTER — Encounter (INDEPENDENT_AMBULATORY_CARE_PROVIDER_SITE_OTHER): Payer: Self-pay | Admitting: Gastroenterology

## 2020-02-19 ENCOUNTER — Other Ambulatory Visit (INDEPENDENT_AMBULATORY_CARE_PROVIDER_SITE_OTHER): Payer: Self-pay | Admitting: *Deleted

## 2020-02-19 ENCOUNTER — Other Ambulatory Visit: Payer: Self-pay

## 2020-02-19 DIAGNOSIS — D649 Anemia, unspecified: Secondary | ICD-10-CM | POA: Diagnosis not present

## 2020-02-19 MED ORDER — PLENVU 140 G PO SOLR
1.0000 | Freq: Once | ORAL | 0 refills | Status: AC
Start: 2020-02-19 — End: 2020-02-19

## 2020-02-19 NOTE — Telephone Encounter (Signed)
Thanks Derrek Gu with me. Harvel Quale, MD Gastroenterology and Hepatology [facility]

## 2020-02-19 NOTE — Telephone Encounter (Signed)
Patient is scheduled for Colonoscopy/EGD on 03/14/20 - needs to stop Xarelto 2 days prior to procedure -- please advise if ok to stop

## 2020-02-19 NOTE — Progress Notes (Signed)
Maylon Peppers, M.D. Gastroenterology & Hepatology [facility] [address] [place], [place] [address]  [address] [place] [address]  Referring MD: Eber Hong, MD  I will communicate my assessment and recommendations to the referring MD via EMR. Note: Occasional unusual wording and randomly placed punctuation marks may result from the use of speech recognition technology to transcribe this document"  Chief Complaint: Anemia.  History of Present Illness: Marvin Sanchez is a 85 y.o. male with PMH non-ischemic cardiomyopathy with systolic heart failure,, afib, hypertension, GERD, CKD stage 3, hyperlipidemia, hypothyroidism, who comes to clinic for evaluation of anemia.  The patient was referred by his primary care physician after he was found to have worsening anemia in most recent blood work-up. He was endorsing some fatigue and shortness of breath for the last couple months.  The patient had CBC performed in 02/03/2020 which showed a hemoglobin of 12.2, although cell count of 8.6 and platelets of 336.  He also had FOBT testing in the office which came back positive.  Disease, he is PCP referred him for further evaluation of the clinic and stop his Xarelto.  He was subsequently seen at his cardiologist office (Dr. Domenic Polite) who restarted the anticoagulation as he had not presented any episode of overt gastrointestinal bleeding.  The patient denies having any lightheadedness, dizziness,  Vomiting, fever, chills, hematochezia, melena, hematemesis, abdominal distention, abdominal pain, jaundice, pruritus or weight changes.   Patient reports he has bouts of watery to loose bowel movements in multiple episodes, not related to any kind of food. This happens 1-2 times a week and has happened for many years. It leads to cramping of his mid abdomen ocassionally.  Most recent echocardiogram showed a ejection fraction of 40%.  Recently evaluated  by cardiology on 02/06/2020 who considered the patient is optimized to undergo this procedure and it will be adequate to stop his anticoagulation for 48 hours prior to the procedure.  Denies intake of NSAIDs, anticoagulants, high dose aspirin, or any other antiplatelet.  Last EGD: more than 5 years ago, no findings per patient. Last Colonoscopy: 2016 - normal per patient, no report available  FHx: neg for any gastrointestinal/liver disease, prostate cancer father, sister had cancer (unknown site) Social: quit smoking in 1970, neg alcohol or illicit drug use  Past Medical History: Past Medical History:  Diagnosis Date  . Allergic rhinitis   . Cardiac defibrillator in place 2016   [place] EL ICD DF4 VR model # [id number], serial [id number]   . Cardiomyopathy (HCC)    LVEF 25-30%  . Chronic atrial fibrillation ([place])   . Chronic systolic heart failure ([place])   . Esophageal polyp   . Essential hypertension   . GERD (gastroesophageal reflux disease)   . History of skin cancer    Squamous cell  . Hyperlipidemia   . Hypothyroidism   . Nonischemic cardiomyopathy (HCC)    Normal coronary arteries at cardiac catheterization 2015  . Osteoarthritis   . Renal calculi   . Type 2 diabetes mellitus ([place])   . Vitamin D deficiency     Past Surgical History: Past Surgical History:  Procedure Laterality Date  . CARDIAC CATHETERIZATION  09/2013  . CARDIAC DEFIBRILLATOR PLACEMENT  01/07/2015   [employer] VR ICD implanted by [employer] in [place]  . CATARACT EXTRACTION    . COLONOSCOPY    . ESOPHAGOGASTRODUODENOSCOPY    . SKIN CANCER REMOVAL     . TONSILLECTOMY  Family History: Family History  Problem Relation Age of Onset  . Prostate cancer Father   . Heart failure Father   . Stroke Mother   . Diabetes Mellitus II Sister   . Diabetes Mellitus II Brother     Social History: Social History   Tobacco Use  Smoking Status Former Smoker  . Types:  Cigarettes  . Quit date: 08/11/1968  . Years since quitting: 51.5  Smokeless Tobacco Never Used   Social History   Substance and Sexual Activity  Alcohol Use No   Social History   Substance and Sexual Activity  Drug Use No    Allergies: Allergies  Allergen Reactions  . Hyzaar [Losartan Potassium-Hctz] Other (See Comments)    Affected his kidneys     Medications: Current Outpatient Medications  Medication Sig Dispense Refill  . acetaminophen (TYLENOL) 500 MG tablet Take 500 mg by mouth every 6 (six) hours as needed for mild pain or headache.    Marland Kitchen amLODipine (NORVASC) 2.5 MG tablet Take 2.5 mg by mouth in the morning and at bedtime.     . cetirizine (ZYRTEC) 10 MG tablet Take 10 mg by mouth every Monday, Wednesday, and Friday.    . Cholecalciferol (VITAMIN D-3) 25 MCG (1000 UT) CAPS Take 1,000 Units by mouth daily.    . cromolyn (NASALCROM) 5.2 MG/ACT nasal spray Place 1 spray into both nostrils daily as needed for allergies or rhinitis.    . Cyanocobalamin 1000 MCG/ML KIT Inject 1,000 mcg as directed every 30 (thirty) days.    Marland Kitchen gabapentin (NEURONTIN) 300 MG capsule Take 300 mg by mouth 2 (two) times daily.    Marland Kitchen levothyroxine (SYNTHROID, LEVOTHROID) 25 MCG tablet Take 25 mcg by mouth daily before breakfast.    . metoprolol succinate (TOPROL-XL) 100 MG 24 hr tablet TAKE 1 TABLET TWICE DAILY (Patient taking differently: Take 100 mg by mouth 2 (two) times daily. ) 180 tablet 3  . omeprazole (PRILOSEC) 20 MG capsule Take 20 mg by mouth daily as needed (for reflux).     Alveda Reasons 15 MG TABS tablet TAKE 1 TABLET DAILY WITH   SUPPER 90 tablet 2  . irbesartan (AVAPRO) 150 MG tablet TAKE 1 TABLET EVERY DAY (Patient not taking: Reported on 02/19/2020) 90 tablet 3   No current facility-administered medications for this visit.    Review of Systems: GENERAL: negative for malaise, significant weight loss, night sweats and fever HEENT: No changes in hearing or vision, no nose bleeds or  other nasal problems. No trouble swallowing NECK: Negative for lumps, goiter, pain and significant neck swelling RESPIRATORY: Negative for cough, wheezing and shortness of breath CARDIOVASCULAR: Negative for chest pain, leg swelling, palpitations, orthopnea GI: SEE HPI MUSCULOSKELETAL: Negative for joint pain or swelling, back pain, and muscle pain. SKIN: Negative for lesions, rash, and itching. PSYCH: Negative for sleep disturbance, mood disorder and recent psychosocial stressors. HEMATOLOGY Negative for prolonged bleeding, bruising easily, and swollen nodes. ENDOCRINE: Negative for cold or heat intolerance, polyuria, polydipsia and goiter. NEURO: negative for lightheadedness, dizziness, tremor, gait imbalance, syncope and seizures. The remainder of the review of systems is noncontributory.   Physical Exam: BP (!) 143/82 (BP Location: Right Arm, Patient Position: Sitting)   Pulse 65   Temp 97.8 F (36.6 C) (Oral)   Ht 5' 10.5" (1.791 m)   Wt 184 lb (83.5 kg)   BMI 26.03 kg/m  GENERAL: The patient is AO x3, in no acute distress. Elder, uses cane. HEENT: Head is normocephalic and  atraumatic. EOMI are intact. Mouth is well hydrated and without lesions. NECK: Supple. No masses LUNGS: Clear to auscultation. No presence of rhonchi/wheezing/rales. Adequate chest expansion HEART: RRR, normal s1 and s2. ABDOMEN: Soft, nontender, no guarding, no peritoneal signs, and nondistended. BS +. No masses. EXTREMITIES: Without any cyanosis, clubbing, rash, lesions or edema. NEUROLOGIC: AOx3, no focal motor deficit. SKIN: no jaundice, no rashes   Imaging/Labs: as above  I personally reviewed and interpreted the available labs, imaging and endoscopic files.  Impression and Plan: Marvin Sanchez is a 85 y.o. male with PMH non-ischemic cardiomyopathy with systolic heart failure,, afib, hypertension, GERD, CKD stage 3, hyperlipidemia, hypothyroidism, who comes to clinic for evaluation of anemia. The  patient has not presented any clinical symptoms of overt gastrointestinal bleeding.  It is unclear etiology of his anemia at this point has no iron studies are available to classify his normocytic anemia.  We will proceed with an EGD and colonoscopy with small bowel biopsies to determine if there are any major alterations leading to his most recent lab normality.  Nevertheless, his hemoglobin is barely below the lower limit and may not explain his symptoms of fatigue.  The patient will need to hold on his anticoagulation 48 hours before the procedure.  He understood and agreed.  Finally, regarding the consistency of his bowel movements, he will benefit from implementing fiber to increase the bulk of his stool, for which Metamucil was recommended.  We will also obtain random colon biopsies to rule out microscopic colitis, although this is less consistent with his presentation.  More than 50% of the office visit was dedicated to discussing the procedure, including the day of and risks involved. Patient understands what the procedure involves including the benefits and any risks. Patient understands alternatives to the proposed procedure. Risks including (but not limited to) bleeding, tearing of the lining (perforation), rupture of adjacent organs, problems with heart and lung function, infection, and medication reactions. A small percentage of complications may require surgery, hospitalization, repeat endoscopic procedure, and/or transfusion. A small percentage of polyps and other tumors may not be seen.  - Schedule EGD and colonoscopy with SB/random colon biopsies - to stop Xarelto 48 hours before procedure - Start taking Metamucil 1 tablespoon daily to improve bowel movement consistency  All questions were answered.      Harvel Quale, MD Gastroenterology and Hepatology [facility]

## 2020-02-19 NOTE — Telephone Encounter (Signed)
OK to hold Xarelto 48 hours prior to procedure and resume night of procedure if OK with MD.

## 2020-02-19 NOTE — Telephone Encounter (Signed)
Patient aware, forwarded to Dr C to review

## 2020-02-19 NOTE — Telephone Encounter (Signed)
Patient needs Plenvu (copay card) ° °

## 2020-02-19 NOTE — Patient Instructions (Addendum)
Schedule EGD and colonoscopy with SB/random colon biopsies - to stop Xarelto 48 hours before procedure Start taking Metamucil 1 tablespoon daily to improve bowel movement consistency

## 2020-03-07 ENCOUNTER — Ambulatory Visit (INDEPENDENT_AMBULATORY_CARE_PROVIDER_SITE_OTHER): Payer: Medicare Other | Admitting: *Deleted

## 2020-03-07 DIAGNOSIS — I428 Other cardiomyopathies: Secondary | ICD-10-CM

## 2020-03-07 LAB — CUP PACEART REMOTE DEVICE CHECK
Battery Remaining Longevity: 144 mo
Battery Remaining Percentage: 100 %
Brady Statistic RV Percent Paced: 2 %
Date Time Interrogation Session: 20210730005100
HighPow Impedance: 39 Ohm
Implantable Lead Implant Date: 20160531
Implantable Lead Location: 753860
Implantable Lead Model: 296
Implantable Lead Serial Number: 129098
Implantable Pulse Generator Implant Date: 20160531
Lead Channel Impedance Value: 375 Ohm
Lead Channel Pacing Threshold Amplitude: 1 V
Lead Channel Pacing Threshold Pulse Width: 0.4 ms
Lead Channel Setting Pacing Amplitude: 2.5 V
Lead Channel Setting Pacing Pulse Width: 0.4 ms
Lead Channel Setting Sensing Sensitivity: 0.6 mV
Pulse Gen Serial Number: 206098

## 2020-03-10 NOTE — Patient Instructions (Signed)
Your procedure is scheduled on: 03/14/2020  Report to [facility] at    6:45 AM.  Call this number if you have problems the morning of surgery: [phone]   Remember:              Follow Directions on the letter you received from Your Physician's office regarding the Bowel Prep              No Smoking the day of Procedure :   Take these medicines the morning of surgery with A SIP OF WATER: Amlodipine, Zyrtec, omeprazole, and metoprolol 9gabapentin  If needed)              Hold Xarelto 2 days per letter from office   Do not wear jewelry, make-up or nail polish.    Do not bring valuables to the hospital.  Contacts, dentures or bridgework may not be worn into surgery.  .   Patients discharged the day of surgery will not be allowed to drive home.     Colonoscopy, Adult, Care After This sheet gives you information about how to care for yourself after your procedure. Your health care provider may also give you more specific instructions. If you have problems or questions, contact your health care provider. What can I expect after the procedure? After the procedure, it is common to have:  A small amount of blood in your stool for 24 hours after the procedure.  Some gas.  Mild abdominal cramping or bloating.  Follow these instructions at home: General instructions   For the first 24 hours after the procedure: ? Do not drive or use machinery. ? Do not sign important documents. ? Do not drink alcohol. ? Do your regular daily activities at a slower pace than normal. ? Eat soft, easy-to-digest foods. ? Rest often.  Take over-the-counter or prescription medicines only as told by your health care provider.  It is up to you to get the results of your procedure. Ask your health care provider, or the department performing the procedure, when your results will be ready. Relieving cramping and bloating  Try walking around when you have cramps or feel bloated.  Apply heat to your  abdomen as told by your health care provider. Use a heat source that your health care provider recommends, such as a moist heat pack or a heating pad. ? Place a towel between your skin and the heat source. ? Leave the heat on for 20-30 minutes. ? Remove the heat if your skin turns bright red. This is especially important if you are unable to feel pain, heat, or cold. You may have a greater risk of getting burned. Eating and drinking  Drink enough fluid to keep your urine clear or pale yellow.  Resume your normal diet as instructed by your health care provider. Avoid heavy or fried foods that are hard to digest.  Avoid drinking alcohol for as long as instructed by your health care provider. Contact a health care provider if:  You have blood in your stool 2-3 days after the procedure. Get help right away if:  You have more than a small spotting of blood in your stool.  You pass large blood clots in your stool.  Your abdomen is swollen.  You have nausea or vomiting.  You have a fever.  You have increasing abdominal pain that is not relieved with medicine. This information is not intended to replace advice given to you by your health care provider. Make sure  you discuss any questions you have with your health care provider. Document Released: 03/09/2004 Document Revised: 04/19/2016 Document Reviewed: 10/07/2015 Elsevier Interactive Patient Education  2018 [facility] [facility], Adult, Care After This sheet gives you information about how to care for yourself after your procedure. Your health care provider may also give you more specific instructions. If you have problems or questions, contact your health care provider. What can I expect after the procedure? After the procedure, it is common to have:  A sore throat.  Mild stomach pain or discomfort.  Bloating.  Nausea. Follow these instructions at home:   Follow instructions from your health care provider about what  to eat or drink after your procedure.  Return to your normal activities as told by your health care provider. Ask your health care provider what activities are safe for you.  Take over-the-counter and prescription medicines only as told by your health care provider.  Do not drive for 24 hours if you were given a sedative during your procedure.  Keep all follow-up visits as told by your health care provider. This is important. Contact a health care provider if you have:  A sore throat that lasts longer than one day.  Trouble swallowing. Get help right away if:  You vomit blood or your vomit looks like coffee grounds.  You have: ? A fever. ? Bloody, black, or tarry stools. ? A severe sore throat or you cannot swallow. ? Difficulty breathing. ? Severe pain in your chest or abdomen. Summary  After the procedure, it is common to have a sore throat, mild stomach discomfort, bloating, and nausea.  Do not drive for 24 hours if you were given a sedative during the procedure.  Follow instructions from your health care provider about what to eat or drink after your procedure.  Return to your normal activities as told by your health care provider. This information is not intended to replace advice given to you by your health care provider. Make sure you discuss any questions you have with your health care provider. Document Revised: 01/17/2018 Document Reviewed: 12/26/2017 Elsevier Patient Education  [place].

## 2020-03-11 NOTE — Progress Notes (Signed)
Remote ICD transmission.   

## 2020-03-12 ENCOUNTER — Other Ambulatory Visit (HOSPITAL_COMMUNITY)
Admission: RE | Admit: 2020-03-12 | Discharge: 2020-03-12 | Disposition: A | Discharge status: Home / Self Care | Payer: Medicare Other | Source: Ambulatory Visit | Attending: Gastroenterology | Admitting: Gastroenterology

## 2020-03-12 ENCOUNTER — Encounter (HOSPITAL_COMMUNITY)
Admission: RE | Admit: 2020-03-12 | Discharge: 2020-03-12 | Disposition: A | Discharge status: Home / Self Care | Payer: Medicare Other | Source: Ambulatory Visit | Attending: Gastroenterology | Admitting: Gastroenterology

## 2020-03-12 ENCOUNTER — Other Ambulatory Visit: Payer: Self-pay

## 2020-03-12 DIAGNOSIS — Z20822 Contact with and (suspected) exposure to covid-19: Secondary | ICD-10-CM | POA: Insufficient documentation

## 2020-03-12 DIAGNOSIS — Z01812 Encounter for preprocedural laboratory examination: Secondary | ICD-10-CM | POA: Diagnosis present

## 2020-03-12 LAB — SARS CORONAVIRUS 2 (TAT 6-24 HRS): SARS Coronavirus 2: NEGATIVE

## 2020-03-14 ENCOUNTER — Encounter (HOSPITAL_COMMUNITY)
Admission: RE | Disposition: A | Discharge status: Home / Self Care | Payer: Self-pay | Source: Home / Self Care | Attending: Gastroenterology

## 2020-03-14 ENCOUNTER — Ambulatory Visit (HOSPITAL_COMMUNITY): Payer: Medicare Other | Admitting: Anesthesiology

## 2020-03-14 ENCOUNTER — Ambulatory Visit (HOSPITAL_COMMUNITY)
Admission: RE | Admit: 2020-03-14 | Discharge: 2020-03-14 | Disposition: A | Discharge status: Home / Self Care | Payer: Medicare Other | Attending: Gastroenterology | Admitting: Gastroenterology

## 2020-03-14 DIAGNOSIS — K5730 Diverticulosis of large intestine without perforation or abscess without bleeding: Secondary | ICD-10-CM | POA: Insufficient documentation

## 2020-03-14 DIAGNOSIS — N183 Chronic kidney disease, stage 3 unspecified: Secondary | ICD-10-CM | POA: Diagnosis not present

## 2020-03-14 DIAGNOSIS — K5520 Angiodysplasia of colon without hemorrhage: Secondary | ICD-10-CM | POA: Insufficient documentation

## 2020-03-14 DIAGNOSIS — D500 Iron deficiency anemia secondary to blood loss (chronic): Secondary | ICD-10-CM | POA: Diagnosis present

## 2020-03-14 DIAGNOSIS — K648 Other hemorrhoids: Secondary | ICD-10-CM | POA: Insufficient documentation

## 2020-03-14 DIAGNOSIS — K3189 Other diseases of stomach and duodenum: Secondary | ICD-10-CM | POA: Diagnosis not present

## 2020-03-14 DIAGNOSIS — E559 Vitamin D deficiency, unspecified: Secondary | ICD-10-CM | POA: Diagnosis not present

## 2020-03-14 DIAGNOSIS — Z95810 Presence of automatic (implantable) cardiac defibrillator: Secondary | ICD-10-CM | POA: Insufficient documentation

## 2020-03-14 DIAGNOSIS — Z8249 Family history of ischemic heart disease and other diseases of the circulatory system: Secondary | ICD-10-CM | POA: Insufficient documentation

## 2020-03-14 DIAGNOSIS — Z833 Family history of diabetes mellitus: Secondary | ICD-10-CM | POA: Insufficient documentation

## 2020-03-14 DIAGNOSIS — E039 Hypothyroidism, unspecified: Secondary | ICD-10-CM | POA: Insufficient documentation

## 2020-03-14 DIAGNOSIS — I130 Hypertensive heart and chronic kidney disease with heart failure and stage 1 through stage 4 chronic kidney disease, or unspecified chronic kidney disease: Secondary | ICD-10-CM | POA: Insufficient documentation

## 2020-03-14 DIAGNOSIS — I428 Other cardiomyopathies: Secondary | ICD-10-CM | POA: Insufficient documentation

## 2020-03-14 DIAGNOSIS — Z8042 Family history of malignant neoplasm of prostate: Secondary | ICD-10-CM | POA: Insufficient documentation

## 2020-03-14 DIAGNOSIS — E1122 Type 2 diabetes mellitus with diabetic chronic kidney disease: Secondary | ICD-10-CM | POA: Diagnosis not present

## 2020-03-14 DIAGNOSIS — I482 Chronic atrial fibrillation, unspecified: Secondary | ICD-10-CM | POA: Insufficient documentation

## 2020-03-14 DIAGNOSIS — E785 Hyperlipidemia, unspecified: Secondary | ICD-10-CM | POA: Diagnosis not present

## 2020-03-14 DIAGNOSIS — I5022 Chronic systolic (congestive) heart failure: Secondary | ICD-10-CM | POA: Diagnosis not present

## 2020-03-14 DIAGNOSIS — K317 Polyp of stomach and duodenum: Secondary | ICD-10-CM | POA: Insufficient documentation

## 2020-03-14 DIAGNOSIS — K449 Diaphragmatic hernia without obstruction or gangrene: Secondary | ICD-10-CM | POA: Diagnosis not present

## 2020-03-14 DIAGNOSIS — Z87891 Personal history of nicotine dependence: Secondary | ICD-10-CM | POA: Insufficient documentation

## 2020-03-14 DIAGNOSIS — Z79899 Other long term (current) drug therapy: Secondary | ICD-10-CM | POA: Insufficient documentation

## 2020-03-14 DIAGNOSIS — Z823 Family history of stroke: Secondary | ICD-10-CM | POA: Insufficient documentation

## 2020-03-14 DIAGNOSIS — Z7901 Long term (current) use of anticoagulants: Secondary | ICD-10-CM | POA: Insufficient documentation

## 2020-03-14 DIAGNOSIS — D122 Benign neoplasm of ascending colon: Secondary | ICD-10-CM

## 2020-03-14 DIAGNOSIS — K219 Gastro-esophageal reflux disease without esophagitis: Secondary | ICD-10-CM | POA: Insufficient documentation

## 2020-03-14 DIAGNOSIS — M1990 Unspecified osteoarthritis, unspecified site: Secondary | ICD-10-CM | POA: Diagnosis not present

## 2020-03-14 DIAGNOSIS — Z87442 Personal history of urinary calculi: Secondary | ICD-10-CM | POA: Diagnosis not present

## 2020-03-14 DIAGNOSIS — Z9849 Cataract extraction status, unspecified eye: Secondary | ICD-10-CM | POA: Insufficient documentation

## 2020-03-14 HISTORY — PX: POLYPECTOMY: SHX5525

## 2020-03-14 HISTORY — PX: BIOPSY: SHX5522

## 2020-03-14 HISTORY — PX: ESOPHAGOGASTRODUODENOSCOPY (EGD) WITH PROPOFOL: SHX5813

## 2020-03-14 HISTORY — PX: COLONOSCOPY WITH PROPOFOL: SHX5780

## 2020-03-14 LAB — GLUCOSE, CAPILLARY
Glucose-Capillary: 103 mg/dL — ABNORMAL HIGH (ref 70–99)
Glucose-Capillary: 101 mg/dL — ABNORMAL HIGH (ref 70–99)

## 2020-03-14 SURGERY — COLONOSCOPY WITH PROPOFOL
Anesthesia: General

## 2020-03-14 MED ORDER — PHENYLEPHRINE HCL (PRESSORS) 10 MG/ML IV SOLN
INTRAVENOUS | Status: DC | PRN
  Administered 2020-03-14: 40 ug via INTRAVENOUS
  Administered 2020-03-14 (×4): 80 ug via INTRAVENOUS

## 2020-03-14 MED ORDER — PROPOFOL 500 MG/50ML IV EMUL
INTRAVENOUS | Status: DC | PRN
  Administered 2020-03-14: 35 ug/kg/min via INTRAVENOUS
  Administered 2020-03-14 (×2): 50 ug/kg/min via INTRAVENOUS

## 2020-03-14 MED ORDER — STERILE WATER FOR IRRIGATION IR SOLN
Status: DC | PRN
  Administered 2020-03-14: 2.5 mL

## 2020-03-14 MED ORDER — PROPOFOL 10 MG/ML IV BOLUS
INTRAVENOUS | Status: DC | PRN
  Administered 2020-03-14 (×2): 20 mg via INTRAVENOUS
  Administered 2020-03-14: 10 mg via INTRAVENOUS
  Administered 2020-03-14: 20 mg via INTRAVENOUS
  Administered 2020-03-14: 50 mg via INTRAVENOUS
  Administered 2020-03-14: 20 mg via INTRAVENOUS
  Administered 2020-03-14: 40 mg via INTRAVENOUS
  Administered 2020-03-14: 20 mg via INTRAVENOUS
  Administered 2020-03-14: 40 mg via INTRAVENOUS

## 2020-03-14 MED ORDER — LIDOCAINE VISCOUS HCL 2 % MT SOLN
OROMUCOSAL | Status: AC
  Filled 2020-03-14: qty 15

## 2020-03-14 MED ORDER — LACTATED RINGERS IV SOLN: Freq: Once | INTRAVENOUS | Status: AC

## 2020-03-14 MED ORDER — LACTATED RINGERS IV SOLN: INTRAVENOUS | Status: DC | PRN

## 2020-03-14 MED ORDER — LIDOCAINE VISCOUS HCL 2 % MT SOLN
15.0000 mL | Freq: Once | OROMUCOSAL | Status: AC
  Administered 2020-03-14: 15 mL via OROMUCOSAL

## 2020-03-14 NOTE — Transfer of Care (Signed)
Immediate Anesthesia Transfer of Care Note  Patient: Marvin Sanchez  Procedure(s) Performed: COLONOSCOPY WITH PROPOFOL (N/A ) ESOPHAGOGASTRODUODENOSCOPY (EGD) WITH PROPOFOL (N/A ) POLYPECTOMY BIOPSY  Patient Location: PACU  Anesthesia Type:General  Level of Consciousness: awake, alert  and patient cooperative  Airway & Oxygen Therapy: Patient Spontanous Breathing  Post-op Assessment: Report given to RN and Post -op Vital signs reviewed and stable  Post vital signs: Reviewed and stable  Last Vitals:  Vitals Value Taken Time  BP    Temp 97.4   Pulse    Resp    SpO2      Last Pain:  Vitals:   03/14/20 0744  PainSc: 0-No pain         Complications: No complications documented.

## 2020-03-14 NOTE — Anesthesia Postprocedure Evaluation (Signed)
Anesthesia Post Note  Patient: [name]  Procedure(s) Performed: COLONOSCOPY WITH PROPOFOL (N/A ) ESOPHAGOGASTRODUODENOSCOPY (EGD) WITH PROPOFOL (N/A ) POLYPECTOMY BIOPSY  Patient location during evaluation: PACU Anesthesia Type: General Level of consciousness: awake and alert and patient cooperative Pain management: satisfactory to patient Vital Signs Assessment: post-procedure vital signs reviewed and stable Respiratory status: spontaneous breathing Cardiovascular status: stable Postop Assessment: no apparent nausea or vomiting Anesthetic complications: no   No complications documented.   Last Vitals:  Vitals:   03/14/20 0930 03/14/20 0945  BP: (!) 154/94 108/69  Pulse: 78 77  Resp: 15 18  Temp: (!) 36.3 C   SpO2: 95% 96%    Last Pain:  Vitals:   03/14/20 0930  PainSc: 0-No pain                 Marvin Sanchez

## 2020-03-14 NOTE — Op Note (Addendum)
[facility] Patient Name: Marvin Sanchez Nurse Procedure Date: 03/14/2020 7:29 AM MRN: [id number] Date of Birth: [birth year] Attending MD: Maylon Peppers ,  CSN: [id number] Age: 85 Admit Type: Outpatient Procedure:                Upper GI endoscopy Indications:              Iron deficiency anemia secondary to chronic blood                            loss Providers:                Maylon Peppers, Lurline Del, RN, Thomas Hoff., Technician, Randa Spike, Technician Referring MD:              Medicines:                Monitored Anesthesia Care Complications:            No immediate complications. Estimated Blood Loss:     Estimated blood loss: none. Procedure:                Pre-Anesthesia Assessment:                           - Prior to the procedure, a History and Physical                            was performed, and patient medications, allergies                            and sensitivities were reviewed. The patient's                            tolerance of previous anesthesia was reviewed.                           - The risks and benefits of the procedure and the                            sedation options and risks were discussed with the                            patient. All questions were answered and informed                            consent was obtained.                           - ASA Grade Assessment: III - A patient with severe                            systemic disease.                           After obtaining informed consent, the endoscope was  passed under direct vision. Throughout the                            procedure, the patient's blood pressure, pulse, and                            oxygen saturations were monitored continuously. The                            [id number]-H190 ([id number]) scope was introduced through the                            mouth, and advanced to the second part of duodenum.                             The upper GI endoscopy was accomplished without                            difficulty. The patient tolerated the procedure                            well. Scope In: 8:05:00 AM Scope Out: 8:54:14 AM Total Procedure Duration: 0 hours 49 minutes 14 seconds  Findings:      A 2 cm hiatal hernia was present.      One 25 mm pedunculated and sessile polyp with no bleeding and stigmata       of recent bleeding was found in the cardia. The polyp was removed in a       piecemeal fashion with a cold snare. Resection and retrieval were       complete. A Jabier Mutton net was used to remove the lesions from gastric       chamber. To prevent bleeding after the polypectomy, one hemostatic clips       was successfully placed, a second clip was deployed close to the area       but not in the area of polypectomy. There was no bleeding at the end of       the procedure.      Multiple 2 to 4 mm sessile polyps with no stigmata of recent bleeding       were found in the gastric fundus.      Five 3 to 4 mm sessile polyps with no bleeding were found in the       duodenal bulb. These polyps were removed with a combination of cold       snare and cold biopsy forceps. Resection and retrieval were complete.      The exam of the duodenum was otherwise normal. Biopsies taken to rule       out celiac disease. Impression:               - 2 cm hiatal hernia.                           - One gastric polyp. Resected and retrieved. Clips                            were placed.                           -  Multiple gastric polyps.                           - Five duodenal polyps. Resected and retrieved.                           - Normal duodenum biopsied. Moderate Sedation:      Per Anesthesia Care Recommendation:           - Discharge patient to home (ambulatory).                           - Resume previous diet.                           - Resume Xarelto (rivaroxaban) at prior dose                             tomorrow.                           - Await pathology results. Procedure Code(s):        --- Professional ---                           [phone], GC, Esophagogastroduodenoscopy, flexible,                            transoral; with removal of tumor(s), polyp(s), or                            other lesion(s) by snare technique Diagnosis Code(s):        --- Professional ---                           K44.9, Diaphragmatic hernia without obstruction or                            gangrene                           K31.7, Polyp of stomach and duodenum                           D50.0, Iron deficiency anemia secondary to blood                            loss (chronic) CPT copyright 2019 [employer]. All rights reserved. The codes documented in this report are preliminary and upon coder review may  be revised to meet current compliance requirements. Maylon Peppers, MD Maylon Peppers,  03/14/2020 9:29:10 AM This report has been signed electronically. Number of Addenda: 0

## 2020-03-14 NOTE — Discharge Instructions (Signed)
You are being discharged to home.  Eat a high fiber diet.  Your physician has indicated that a repeat colonoscopy is not recommended due to your current age (83 years or older) for screening purposes.  We are waiting for your pathology results.  Resume taking Xarelto (rivaroxaban) at your prior dose tomorrow.       Upper Endoscopy, Adult, Care After This sheet gives you information about how to care for yourself after your procedure. Your health care provider may also give you more specific instructions. If you have problems or questions, contact your health care provider. What can I expect after the procedure? After the procedure, it is common to have:  A sore throat.  Mild stomach pain or discomfort.  Bloating.  Nausea. Follow these instructions at home:   Follow instructions from your health care provider about what to eat or drink after your procedure.  Return to your normal activities as told by your health care provider. Ask your health care provider what activities are safe for you.  Take over-the-counter and prescription medicines only as told by your health care provider.  Do not drive for 24 hours if you were given a sedative during your procedure.  Keep all follow-up visits as told by your health care provider. This is important. Contact a health care provider if you have:  A sore throat that lasts longer than one day.  Trouble swallowing. Get help right away if:  You vomit blood or your vomit looks like coffee grounds.  You have: ? A fever. ? Bloody, black, or tarry stools. ? A severe sore throat or you cannot swallow. ? Difficulty breathing. ? Severe pain in your chest or abdomen. Summary  After the procedure, it is common to have a sore throat, mild stomach discomfort, bloating, and nausea.  Do not drive for 24 hours if you were given a sedative during the procedure.  Follow instructions from your health care provider about what to eat or drink  after your procedure.  Return to your normal activities as told by your health care provider. This information is not intended to replace advice given to you by your health care provider. Make sure you discuss any questions you have with your health care provider. Document Revised: 01/17/2018 Document Reviewed: 12/26/2017 Elsevier Patient Education  [place].     Colonoscopy, Adult, Care After This sheet gives you information about how to care for yourself after your procedure. Your doctor may also give you more specific instructions. If you have problems or questions, call your doctor. What can I expect after the procedure? After the procedure, it is common to have:  A small amount of blood in your poop (stool) for 24 hours.  Some gas.  Mild cramping or bloating in your belly (abdomen). Follow these instructions at home: Eating and drinking   Drink enough fluid to keep your pee (urine) pale yellow.  Follow instructions from your doctor about what you cannot eat or drink.  Return to your normal diet as told by your doctor. Avoid heavy or fried foods that are hard to digest. Activity  Rest as told by your doctor.  Do not sit for a long time without moving. Get up to take short walks every 1-2 hours. This is important. Ask for help if you feel weak or unsteady.  Return to your normal activities as told by your doctor. Ask your doctor what activities are safe for you. To help cramping and bloating:   Try  walking around.  Put heat on your belly as told by your doctor. Use the heat source that your doctor recommends, such as a moist heat pack or a heating pad. ? Put a towel between your skin and the heat source. ? Leave the heat on for 20-30 minutes. ? Remove the heat if your skin turns bright red. This is very important if you are unable to feel pain, heat, or cold. You may have a greater risk of getting burned. General instructions  For the first 24 hours after  the procedure: ? Do not drive or use machinery. ? Do not sign important documents. ? Do not drink alcohol. ? Do your daily activities more slowly than normal. ? Eat foods that are soft and easy to digest.  Take over-the-counter or prescription medicines only as told by your doctor.  Keep all follow-up visits as told by your doctor. This is important. Contact a doctor if:  You have blood in your poop 2-3 days after the procedure. Get help right away if:  You have more than a small amount of blood in your poop.  You see large clumps of tissue (blood clots) in your poop.  Your belly is swollen.  You feel like you may vomit (nauseous).  You vomit.  You have a fever.  You have belly pain that gets worse, and medicine does not help your pain. Summary  After the procedure, it is common to have a small amount of blood in your poop. You may also have mild cramping and bloating in your belly.  For the first 24 hours after the procedure, do not drive or use machinery, do not sign important documents, and do not drink alcohol.  Get help right away if you have a lot of blood in your poop, feel like you may vomit, have a fever, or have more belly pain. This information is not intended to replace advice given to you by your health care provider. Make sure you discuss any questions you have with your health care provider. Document Revised: 02/19/2019 Document Reviewed: 02/19/2019 Elsevier Patient Education  [place] After These instructions provide you with information about caring for yourself after your procedure. Your health care provider may also give you more specific instructions. Your treatment has been planned according to current medical practices, but problems sometimes occur. Call your health care provider if you have any problems or questions after your procedure. What can I expect after the procedure? After your procedure, you  may:  Feel sleepy for several hours.  Feel clumsy and have poor balance for several hours.  Feel forgetful about what happened after the procedure.  Have poor judgment for several hours.  Feel nauseous or vomit.  Have a sore throat if you had a breathing tube during the procedure. Follow these instructions at home: For at least 24 hours after the procedure:      Have a responsible adult stay with you. It is important to have someone help care for you until you are awake and alert.  Rest as needed.  Do not: ? Participate in activities in which you could fall or become injured. ? Drive. ? Use heavy machinery. ? Drink alcohol. ? Take sleeping pills or medicines that cause drowsiness. ? Make important decisions or sign legal documents. ? Take care of children on your own. Eating and drinking  Follow the diet that is recommended by your health care provider.  If you  vomit, drink water, juice, or soup when you can drink without vomiting.  Make sure you have little or no nausea before eating solid foods. General instructions  Take over-the-counter and prescription medicines only as told by your health care provider.  If you have sleep apnea, surgery and certain medicines can increase your risk for breathing problems. Follow instructions from your health care provider about wearing your sleep device: ? Anytime you are sleeping, including during daytime naps. ? While taking prescription pain medicines, sleeping medicines, or medicines that make you drowsy.  If you smoke, do not smoke without supervision.  Keep all follow-up visits as told by your health care provider. This is important. Contact a health care provider if:  You keep feeling nauseous or you keep vomiting.  You feel light-headed.  You develop a rash.  You have a fever. Get help right away if:  You have trouble breathing. Summary  For several hours after your procedure, you may feel sleepy and have  poor judgment.  Have a responsible adult stay with you for at least 24 hours or until you are awake and alert. This information is not intended to replace advice given to you by your health care provider. Make sure you discuss any questions you have with your health care provider. Document Revised: 10/24/2017 Document Reviewed: 11/16/2015 Elsevier Patient Education  [place].     High-Fiber Diet Fiber, also called dietary fiber, is a type of carbohydrate that is found in fruits, vegetables, whole grains, and beans. A high-fiber diet can have many health benefits. Your health care provider may recommend a high-fiber diet to help:  Prevent constipation. Fiber can make your bowel movements more regular.  Lower your cholesterol.  Relieve the following conditions: ? Swelling of veins in the anus (hemorrhoids). ? Swelling and irritation (inflammation) of specific areas of the digestive tract (uncomplicated diverticulosis). ? A problem of the large intestine (colon) that sometimes causes pain and diarrhea (irritable bowel syndrome, IBS).  Prevent overeating as part of a weight-loss plan.  Prevent heart disease, type 2 diabetes, and certain cancers. What is my plan? The recommended daily fiber intake in grams (g) includes:  38 g for men age 57 or younger.  30 g for men over age 83.  57 g for women age 23 or younger.  21 g for women over age 38. You can get the recommended daily intake of dietary fiber by:  Eating a variety of fruits, vegetables, grains, and beans.  Taking a fiber supplement, if it is not possible to get enough fiber through your diet. What do I need to know about a high-fiber diet?  It is better to get fiber through food sources rather than from fiber supplements. There is not a lot of research about how effective supplements are.  Always check the fiber content on the nutrition facts label of any prepackaged food. Look for foods that contain 5 g of  fiber or more per serving.  Talk with a diet and nutrition specialist (dietitian) if you have questions about specific foods that are recommended or not recommended for your medical condition, especially if those foods are not listed below.  Gradually increase how much fiber you consume. If you increase your intake of dietary fiber too quickly, you may have bloating, cramping, or gas.  Drink plenty of water. Water helps you to digest fiber. What are tips for following this plan?  Eat a wide variety of high-fiber foods.  Make sure that half  of the grains that you eat each day are whole grains.  Eat breads and cereals that are made with whole-grain flour instead of refined flour or white flour.  Eat brown rice, bulgur wheat, or millet instead of white rice.  Start the day with a breakfast that is high in fiber, such as a cereal that contains 5 g of fiber or more per serving.  Use beans in place of meat in soups, salads, and pasta dishes.  Eat high-fiber snacks, such as berries, raw vegetables, nuts, and popcorn.  Choose whole fruits and vegetables instead of processed forms like juice or sauce. What foods can I eat?  Fruits Berries. Pears. Apples. Oranges. Avocado. Prunes and raisins. Dried figs. Vegetables Sweet potatoes. Spinach. Kale. Artichokes. Cabbage. Broccoli. Cauliflower. Green peas. Carrots. Squash. Grains Whole-grain breads. Multigrain cereal. Oats and oatmeal. Brown rice. Barley. Bulgur wheat. [place]. Quinoa. Bran muffins. Popcorn. Rye wafer crackers. Meats and other proteins Navy, kidney, and pinto beans. Soybeans. Split peas. Lentils. Nuts and seeds. Dairy Fiber-fortified yogurt. Beverages Fiber-fortified soy milk. Fiber-fortified orange juice. Other foods Fiber bars. The items listed above may not be a complete list of recommended foods and beverages. Contact a dietitian for more options. What foods are not recommended? Fruits Fruit juice. Cooked, strained  fruit. Vegetables Fried potatoes. Canned vegetables. Well-cooked vegetables. Grains White bread. Pasta made with refined flour. White rice. Meats and other proteins Fatty cuts of meat. Fried chicken or fried fish. Dairy Milk. Yogurt. Cream cheese. Sour cream. Fats and oils Butters. Beverages Soft drinks. Other foods Cakes and pastries. The items listed above may not be a complete list of foods and beverages to avoid. Contact a dietitian for more information. Summary  Fiber is a type of carbohydrate. It is found in fruits, vegetables, whole grains, and beans.  There are many health benefits of eating a high-fiber diet, such as preventing constipation, lowering blood cholesterol, helping with weight loss, and reducing your risk of heart disease, diabetes, and certain cancers.  Gradually increase your intake of fiber. Increasing too fast can result in cramping, bloating, and gas. Drink plenty of water while you increase your fiber.  The best sources of fiber include whole fruits and vegetables, whole grains, nuts, seeds, and beans. This information is not intended to replace advice given to you by your health care provider. Make sure you discuss any questions you have with your health care provider. Document Revised: 05/30/2017 Document Reviewed: 05/30/2017 Elsevier Patient Education  2020 [employer].

## 2020-03-14 NOTE — H&P (Signed)
Marvin Sanchez is an 85 y.o. male.   Chief Complaint: Iron deficiency anemia HPI: 85 y.o. male with PMH non-ischemic cardiomyopathy with systolic heart failure,, afib, hypertension, GERD, CKD stage 3, hyperlipidemia, hypothyroidism, who comes to the hospital for evaluation of iron deficiency anemia with an EGD and colonoscopy. The patient denied having melena, hematochezia or melena.  He recently tested positive for FOBT.  His most recent CBC showed a hemoglobin of 12.2 with MCV 84.9.  The patient has been taking Xarelto for A. Fib.  Last EGD: more than 5 years ago, no findings per patient. Last Colonoscopy: 2016 - normal per patient, no report available  Past Medical History:  Diagnosis Date  . Allergic rhinitis   . Cardiac defibrillator in place 2016   [place] EL ICD DF4 VR model # [id number], serial [id number]   . Cardiomyopathy (HCC)    LVEF 25-30%  . Chronic atrial fibrillation ([place])   . Chronic systolic heart failure ([place])   . Esophageal polyp   . Essential hypertension   . GERD (gastroesophageal reflux disease)   . History of skin cancer    Squamous cell  . Hyperlipidemia   . Hypothyroidism   . Nonischemic cardiomyopathy (HCC)    Normal coronary arteries at cardiac catheterization 2015  . Osteoarthritis   . Renal calculi   . Type 2 diabetes mellitus ([place])   . Vitamin D deficiency     Past Surgical History:  Procedure Laterality Date  . CARDIAC CATHETERIZATION  09/2013  . CARDIAC DEFIBRILLATOR PLACEMENT  01/07/2015   [employer] VR ICD implanted by [employer] in [place]  . CATARACT EXTRACTION    . COLONOSCOPY    . ESOPHAGOGASTRODUODENOSCOPY    . SKIN CANCER REMOVAL     . TONSILLECTOMY      Family History  Problem Relation Age of Onset  . Prostate cancer Father   . Heart failure Father   . Stroke Mother   . Diabetes Mellitus II Sister   . Diabetes Mellitus II Brother    Social History:  reports that he quit smoking about 51 years  ago. His smoking use included cigarettes. He has never used smokeless tobacco. He reports that he does not drink alcohol and does not use drugs.  Allergies:  Allergies  Allergen Reactions  . Hyzaar [Losartan Potassium-Hctz] Other (See Comments)    Affected his kidneys     Medications Prior to Admission  Medication Sig Dispense Refill  . acetaminophen (TYLENOL) 500 MG tablet Take 500 mg by mouth every 6 (six) hours as needed for mild pain or headache.    Marland Kitchen amLODipine (NORVASC) 2.5 MG tablet Take 2.5 mg by mouth in the morning and at bedtime.     . cetirizine (ZYRTEC) 10 MG tablet Take 10 mg by mouth daily.     . Cholecalciferol (VITAMIN D-3) 25 MCG (1000 UT) CAPS Take 1,000 Units by mouth daily.    . cromolyn (NASALCROM) 5.2 MG/ACT nasal spray Place 1 spray into both nostrils daily as needed for allergies or rhinitis.    Marland Kitchen gabapentin (NEURONTIN) 300 MG capsule Take 300 mg by mouth 2 (two) times daily.    . irbesartan (AVAPRO) 150 MG tablet TAKE 1 TABLET EVERY DAY (Patient taking differently: Take 150 mg by mouth at bedtime. ) 90 tablet 3  . levothyroxine (SYNTHROID, LEVOTHROID) 25 MCG tablet Take 25 mcg by mouth daily before breakfast.    . metoprolol succinate (TOPROL-XL) 100 MG 24 hr tablet TAKE 1  TABLET TWICE DAILY (Patient taking differently: Take 100 mg by mouth 2 (two) times daily. ) 180 tablet 3  . omeprazole (PRILOSEC) 20 MG capsule Take 20 mg by mouth every Monday, Wednesday, and Friday.     Marland Kitchen PLENVU 140 g SOLR Take 140 g by mouth once.    Alveda Reasons 15 MG TABS tablet TAKE 1 TABLET DAILY WITH   SUPPER (Patient taking differently: Take 15 mg by mouth at bedtime. ) 90 tablet 2  . Cyanocobalamin 1000 MCG/ML KIT Inject 1,000 mcg as directed every 30 (thirty) days.      Results for orders placed or performed during the hospital encounter of 03/14/20 (from the past 48 hour(s))  Glucose, capillary     Status: Abnormal   Collection Time: 03/14/20  7:23 AM  Result Value Ref Range    Glucose-Capillary 103 (H) 70 - 99 mg/dL    Comment: Glucose reference range applies only to samples taken after fasting for at least 8 hours.   No results found.  Review of Systems  Constitutional: Positive for fatigue.  HENT: Negative.   Eyes: Negative.   Respiratory: Negative.   Cardiovascular: Negative.   Gastrointestinal: Negative.   Endocrine: Negative.   Genitourinary: Negative.   Musculoskeletal: Negative.   Skin: Negative.   Allergic/Immunologic: Negative.   Neurological: Negative.   Hematological: Negative.   Psychiatric/Behavioral: Negative.     Blood pressure (!) 154/98, pulse 92, temperature (!) 97.5 F (36.4 C), resp. rate 18, SpO2 98 %. Physical Exam  GENERAL: The patient is AO x3, in no acute distress. HEENT: Head is normocephalic and atraumatic. EOMI are intact. Mouth is well hydrated and without lesions. NECK: Supple. No masses LUNGS: Clear to auscultation. No presence of rhonchi/wheezing/rales. Adequate chest expansion HEART: RRR, normal s1 and s2. ABDOMEN: Soft, nontender, no guarding, no peritoneal signs, and nondistended. BS +. No masses. EXTREMITIES: Without any cyanosis, clubbing, rash, lesions or edema. NEUROLOGIC: AOx3, no focal motor deficit. SKIN: no jaundice, no rashes  Assessment/Plan 85 y.o. male with PMH non-ischemic cardiomyopathy with systolic heart failure,, afib, hypertension, GERD, CKD stage 3, hyperlipidemia, hypothyroidism, who comes to the hospital for evaluation of iron deficiency anemia.  The patient will undergo EGD and colonoscopy to investigate his iron deficiency anemia.  Fortunately, he had recent CBC which showed improvement in his hemoglobin levels.  Harvel Quale, MD 03/14/2020, 7:53 AM

## 2020-03-14 NOTE — Anesthesia Preprocedure Evaluation (Signed)
Anesthesia Evaluation  Patient identified by MRN, date of birth, ID band Patient awake    Reviewed: Allergy & Precautions, NPO status , Patient's Chart, lab work & pertinent test results, reviewed documented beta blocker date and time   History of Anesthesia Complications Negative for: history of anesthetic complications  Airway Mallampati: II  TM Distance: >3 FB Neck ROM: Full    Dental  (+) Edentulous Upper, Edentulous Lower   Pulmonary former smoker,    Pulmonary exam normal breath sounds clear to auscultation       Cardiovascular METS: 3 - Mets hypertension, Pt. on medications and Pt. on home beta blockers +CHF (EF - 25% to 30%)  Normal cardiovascular exam+ dysrhythmias Atrial Fibrillation + Cardiac Defibrillator  Rhythm:Regular Rate:Normal  1. The left ventricle has a visually estimated ejection fraction of of  40%. The cavity size was normal. There is mildly increased left  ventricular wall thickness. Left ventricular diastology could not be  evaluated secondary to atrial fibrillation.  Elevated left ventricular end-diastolic pressure Left ventricular diffuse  hypokinesis.  2. The right ventricle has moderately reduced systolic function. The  cavity was normal. There is no increase in right ventricular wall  thickness.  3. Left atrial size was severely dilated.  4. Right atrial size was mildly dilated.  5. The mitral valve is normal in structure.  6. The tricuspid valve is normal in structure.  7. The aortic valve is tricuspid.  8. The aortic root is normal in size and structure.  9. Right atrial pressure is estimated at 10 mmHg.    Neuro/Psych negative neurological ROS  negative psych ROS   GI/Hepatic GERD  Medicated and Controlled,  Endo/Other  diabetes, Well Controlled, Type 2Hypothyroidism   Renal/GU Renal InsufficiencyRenal disease  negative genitourinary   Musculoskeletal  (+) Arthritis ,    Abdominal   Peds  Hematology  (+) anemia ,   Anesthesia Other Findings   Reproductive/Obstetrics                            Anesthesia Physical Anesthesia Plan  ASA: IV  Anesthesia Plan: General   Post-op Pain Management:    Induction: Intravenous  PONV Risk Score and Plan:   Airway Management Planned: Nasal Cannula, Natural Airway and Simple Face Mask  Additional Equipment:   Intra-op Plan:   Post-operative Plan:   Informed Consent: I have reviewed the patients History and Physical, chart, labs and discussed the procedure including the risks, benefits and alternatives for the proposed anesthesia with the patient or authorized representative who has indicated his/her understanding and acceptance.     Dental advisory given  Plan Discussed with: CRNA and Surgeon  Anesthesia Plan Comments: (Risk of kidney function getting worse was explained.)       Anesthesia Quick Evaluation

## 2020-03-14 NOTE — Op Note (Signed)
[facility] Patient Name: Marvin Sanchez Procedure Date: 03/14/2020 8:58 AM MRN: [id number] Date of Birth: 1935-04-11 Attending MD: Maylon Peppers ,  CSN: [id number] Age: 85 Admit Type: Outpatient Procedure:                Colonoscopy Indications:              Iron deficiency anemia secondary to chronic blood                            loss Providers:                Maylon Peppers, Lurline Del, RN, Randa Spike,                            Technician Referring MD:              Medicines:                Monitored Anesthesia Care Complications:            No immediate complications. Estimated Blood Loss:     Estimated blood loss: none. Procedure:                Pre-Anesthesia Assessment:                           - Prior to the procedure, a History and Physical                            was performed, and patient medications, allergies                            and sensitivities were reviewed. The patient's                            tolerance of previous anesthesia was reviewed.                           - The risks and benefits of the procedure and the                            sedation options and risks were discussed with the                            patient. All questions were answered and informed                            consent was obtained.                           - ASA Grade Assessment: III - A patient with severe                            systemic disease.                           After obtaining informed consent, the colonoscope  was passed under direct vision. Throughout the                            procedure, the patient's blood pressure, pulse, and                            oxygen saturations were monitored continuously. The                            [id number] ([id number]) scope was introduced through                            the anus and advanced to the the cecum, identified                            by appendiceal orifice  and ileocecal valve. The                            colonoscopy was performed without difficulty. The                            patient tolerated the procedure well. The quality                            of the bowel preparation was good. Scope withdrawal                            time was 8 minutes. Scope In: 9:00:44 AM Scope Out: 9:20:39 AM Scope Withdrawal Time: 0 hours 8 minutes 41 seconds  Total Procedure Duration: 0 hours 19 minutes 55 seconds  Findings:      The perianal and digital rectal examinations were normal.      A 5 mm polyp was found in the ascending colon. The polyp was       semi-pedunculated. The polyp was removed with a cold snare. Resection       and retrieval were complete.      Two small angiodysplastic lesions without bleeding were found in the       ascending colon.      Multiple small and large-mouthed diverticula were found in the sigmoid       colon, descending colon and ascending colon.      Internal hemorrhoids were found during retroflexion. The hemorrhoids       were small. Impression:               - One 5 mm polyp in the ascending colon, removed                            with a cold snare. Resected and retrieved.                           - Two non-bleeding colonic angiodysplastic lesions.                           - Diverticulosis in the sigmoid colon, in the  descending colon and in the ascending colon.                           - Internal hemorrhoids. Moderate Sedation:      Per Anesthesia Care Recommendation:           - Discharge patient to home (ambulatory).                           - High fiber diet.                           - Repeat colonoscopy is not recommended due to                            current age (25 years or older) for screening                            purposes.                           - Await pathology results. Procedure Code(s):        --- Professional ---                           [phone],  GC, Colonoscopy, flexible; with removal of                            tumor(s), polyp(s), or other lesion(s) by snare                            technique Diagnosis Code(s):        --- Professional ---                           K63.5, Polyp of colon                           K55.20, Angiodysplasia of colon without hemorrhage                           K64.8, Other hemorrhoids                           D50.0, Iron deficiency anemia secondary to blood                            loss (chronic)                           K57.30, Diverticulosis of large intestine without                            perforation or abscess without bleeding CPT copyright 2019 American Medical Association. All rights reserved. The codes documented in this report are preliminary and upon coder review may  be revised to meet current compliance requirements. Maylon Peppers, MD Maylon Peppers,  03/14/2020 9:33:59 AM This report has been  signed electronically. Number of Addenda: 0

## 2020-03-14 NOTE — Anesthesia Procedure Notes (Signed)
Procedure Name: MAC Date/Time: 03/14/2020 7:59 AM Performed by: Vista Deck, CRNA Pre-anesthesia Checklist: Patient identified, Emergency Drugs available, Suction available, Timeout performed and Patient being monitored Patient Re-evaluated:Patient Re-evaluated prior to induction Oxygen Delivery Method: Non-rebreather mask

## 2020-03-17 LAB — SURGICAL PATHOLOGY

## 2020-03-19 ENCOUNTER — Encounter (HOSPITAL_COMMUNITY): Payer: Self-pay | Admitting: Gastroenterology

## 2020-03-31 ENCOUNTER — Ambulatory Visit (INDEPENDENT_AMBULATORY_CARE_PROVIDER_SITE_OTHER): Payer: Medicare Other | Admitting: Cardiology

## 2020-03-31 ENCOUNTER — Encounter: Payer: Self-pay | Admitting: Cardiology

## 2020-03-31 ENCOUNTER — Other Ambulatory Visit: Payer: Self-pay

## 2020-03-31 VITALS — BP 118/72 | HR 74 | Ht 70.5 in | Wt 187.0 lb

## 2020-03-31 DIAGNOSIS — N1832 Chronic kidney disease, stage 3b: Secondary | ICD-10-CM

## 2020-03-31 DIAGNOSIS — I428 Other cardiomyopathies: Secondary | ICD-10-CM | POA: Diagnosis not present

## 2020-03-31 DIAGNOSIS — I4821 Permanent atrial fibrillation: Secondary | ICD-10-CM | POA: Diagnosis not present

## 2020-03-31 NOTE — Patient Instructions (Signed)
Your physician wants you to follow-up in: [place] will receive a reminder letter in the mail two months in advance. If you don't receive a letter, please call our office to schedule the follow-up appointment.  Your physician recommends that you continue on your current medications as directed. Please refer to the Current Medication list given to you today.  Your physician recommends that you return for lab work in: [place] CBC/BMP  Thank you for choosing [facility]!!

## 2020-03-31 NOTE — Progress Notes (Signed)
Cardiology Office Note  Date: 03/31/2020   ID: Marvin Sanchez, DOB August 22, 1934, MRN [id number]  PCP:  Eber Hong, MD  Cardiologist:  Rozann Lesches, MD Electrophysiologist:  Thompson Grayer, MD   Chief Complaint  Patient presents with  . Cardiac follow-up    History of Present Illness: Marvin Sanchez is an 85 y.o. male last seen in June.  He presents for a follow-up visit.  I reviewed interval records, he underwent EGD and colonoscopy with Dr. Jenetta Sanchez recently, I reviewed the reports, gastric and colonic polyps were resected.  He has been back on Xarelto and states that he is doing well at this time.  He follows with Dr. Rayann Sanchez, [facility] Scientific ICD in place.  Interrogation in July revealed normal function.  He does not describe any sense of palpitations and has had no device shocks or syncope.  I reviewed his cardiac medications which are outlined below.  He will be seeing Dr. Brynda Greathouse with lab work in early September.  Past Medical History:  Diagnosis Date  . Allergic rhinitis   . Cardiac defibrillator in place 2016   [place] EL ICD DF4 VR model # [id number], serial [id number]   . Cardiomyopathy (HCC)    LVEF 25-30%  . Chronic atrial fibrillation ([place])   . Chronic systolic heart failure ([place])   . Esophageal polyp   . Essential hypertension   . GERD (gastroesophageal reflux disease)   . History of skin cancer    Squamous cell  . Hyperlipidemia   . Hypothyroidism   . Nonischemic cardiomyopathy (HCC)    Normal coronary arteries at cardiac catheterization 2015  . Osteoarthritis   . Renal calculi   . Type 2 diabetes mellitus ([place])   . Vitamin D deficiency     Past Surgical History:  Procedure Laterality Date  . BIOPSY  03/14/2020   Procedure: BIOPSY;  Surgeon: Marvin Quale, MD;  [facility];  Service: Gastroenterology;;  duodenal  . CARDIAC CATHETERIZATION  09/2013  . CARDIAC DEFIBRILLATOR PLACEMENT  01/07/2015   [employer] VR ICD implanted by [employer] in [place]  . CATARACT EXTRACTION    . COLONOSCOPY    . COLONOSCOPY WITH PROPOFOL N/A 03/14/2020   Procedure: COLONOSCOPY WITH PROPOFOL;  Surgeon: Marvin Quale, MD;  [facility];  Service: Gastroenterology;  Laterality: N/A;  815  . ESOPHAGOGASTRODUODENOSCOPY    . ESOPHAGOGASTRODUODENOSCOPY (EGD) WITH PROPOFOL N/A 03/14/2020   Procedure: ESOPHAGOGASTRODUODENOSCOPY (EGD) WITH PROPOFOL;  Surgeon: Marvin Quale, MD;  [facility];  Service: Gastroenterology;  Laterality: N/A;  . POLYPECTOMY  03/14/2020   Procedure: POLYPECTOMY;  Surgeon: Marvin Quale, MD;  [facility];  Service: Gastroenterology;;  gastric, duodenal,ascending  . SKIN CANCER REMOVAL     . TONSILLECTOMY      Current Outpatient Medications  Medication Sig Dispense Refill  . acetaminophen (TYLENOL) 500 MG tablet Take 500 mg by mouth every 6 (six) hours as needed for mild pain or headache.    Marland Kitchen amLODipine (NORVASC) 2.5 MG tablet Take 2.5 mg by mouth in the morning and at bedtime.     . cetirizine (ZYRTEC) 10 MG tablet Take 10 mg by mouth daily.     . Cholecalciferol (VITAMIN D-3) 25 MCG (1000 UT) CAPS Take 1,000 Units by mouth daily.    . cromolyn (NASALCROM) 5.2 MG/ACT nasal spray Place 1 spray into both nostrils daily as needed for allergies or rhinitis.    Marland Kitchen  Cyanocobalamin 1000 MCG/ML KIT Inject 1,000 mcg as directed every 30 (thirty) days.    Marland Kitchen gabapentin (NEURONTIN) 300 MG capsule Take 300 mg by mouth 2 (two) times daily.    . irbesartan (AVAPRO) 150 MG tablet TAKE 1 TABLET EVERY DAY (Patient taking differently: Take 150 mg by mouth at bedtime. ) 90 tablet 3  . levothyroxine (SYNTHROID, LEVOTHROID) 25 MCG tablet Take 25 mcg by mouth daily before breakfast.    . metoprolol succinate (TOPROL-XL) 100 MG 24 hr tablet TAKE 1 TABLET TWICE DAILY (Patient taking differently: Take 100 mg by mouth 2 (two) times daily. ) 180  tablet 3  . omeprazole (PRILOSEC) 20 MG capsule Take 20 mg by mouth every Monday, Wednesday, and Friday.     Alveda Reasons 15 MG TABS tablet TAKE 1 TABLET DAILY WITH   SUPPER (Patient taking differently: Take 15 mg by mouth at bedtime. ) 90 tablet 2   No current facility-administered medications for this visit.   Allergies:  Hyzaar [losartan potassium-hctz]   ROS:   No palpitations or syncope.  Physical Exam: VS:  BP 118/72   Pulse 74   Ht 5' 10.5" (1.791 m)   Wt 187 lb (84.8 kg)   SpO2 97%   BMI 26.45 kg/m , BMI Body mass index is 26.45 kg/m.  Wt Readings from Last 3 Encounters:  03/31/20 187 lb (84.8 kg)  02/19/20 184 lb (83.5 kg)  02/06/20 187 lb (84.8 kg)    General: Elderly male, appears comfortable at rest. HEENT: Conjunctiva and lids normal, wearing a mask. Neck: Supple, no elevated JVP or carotid bruits, no thyromegaly. Lungs: Clear to auscultation, nonlabored breathing at rest. Cardiac: Irregularly irregular, no S3 or significant systolic murmur. Extremities: No pitting edema, distal pulses 2+.  ECG:  An ECG dated 02/03/2020 was personally reviewed today and demonstrated:  Atrial fibrillation with low voltage, nonspecific T wave changes.  Recent Labwork: 02/03/2020: BUN 26; Creatinine, Ser 1.72; Hemoglobin 12.2; Platelets 336; Potassium 4.4; Sodium 140; TSH 4.401   Other Studies Reviewed Today:  Echocardiogram 09/20/2018: 1. The left ventricle has a visually estimated ejection fraction of of  40%. The cavity size was normal. There is mildly increased left  ventricular wall thickness. Left ventricular diastology could not be  evaluated secondary to atrial fibrillation.  Elevated left ventricular end-diastolic pressure Left ventricular diffuse  hypokinesis.  2. The right ventricle has moderately reduced systolic function. The  cavity was normal. There is no increase in right ventricular wall  thickness.  3. Left atrial size was severely dilated.  4. Right atrial  size was mildly dilated.  5. The mitral valve is normal in structure.  6. The tricuspid valve is normal in structure.  7. The aortic valve is tricuspid.  8. The aortic root is normal in size and structure.  9. Right atrial pressure is estimated at 10 mmHg.   Assessment and Plan:  1.  Permanent atrial fibrillation with CHA2DS2-VASc or 5.  He is asymptomatic and continues on Toprol-XL with renally adjusted Xarelto.  No reported bleeding problems at this time.  Follow-up CBC and BMET for next visit.  2.  Nonischemic cardiomyopathy with LVEF approximately 40%.  Weight is stable and he reports NYHA class II dyspnea.  Continue Avapro and Toprol-XL, currently not on diuretic.  He is not on Aldactone with CKD stage IIIb.  3.  CKD stage IIIb, creatinine 1.72.  4.  [employer] ICD in place.  Continue to follow with Dr. Rayann Sanchez.  No device  shocks or syncope.  Medication Adjustments/Labs and Tests Ordered: Current medicines are reviewed at length with the patient today.  Concerns regarding medicines are outlined above.   Tests Ordered: Orders Placed This Encounter  Procedures  . CBC  . Basic Metabolic Panel (BMET)    Medication Changes: No orders of the defined types were placed in this encounter.   Disposition:  Follow up 6 months in the [place] office.  Signed, Satira Sark, MD, [facility] 03/31/2020 2:35 PM    [facility] [place], [place], [place] [address] Phone: [phone]; Fax: [phone]

## 2020-04-16 ENCOUNTER — Other Ambulatory Visit: Payer: Self-pay | Admitting: Cardiology

## 2020-06-06 ENCOUNTER — Ambulatory Visit (INDEPENDENT_AMBULATORY_CARE_PROVIDER_SITE_OTHER): Payer: Medicare Other

## 2020-06-06 DIAGNOSIS — I428 Other cardiomyopathies: Secondary | ICD-10-CM

## 2020-06-06 LAB — CUP PACEART REMOTE DEVICE CHECK
Battery Remaining Longevity: 144 mo
Battery Remaining Percentage: 100 %
Brady Statistic RV Percent Paced: 2 %
Date Time Interrogation Session: 20211029005000
HighPow Impedance: 42 Ohm
Implantable Lead Implant Date: 20160531
Implantable Lead Location: 753860
Implantable Lead Model: 296
Implantable Lead Serial Number: 129098
Implantable Pulse Generator Implant Date: 20160531
Lead Channel Impedance Value: 393 Ohm
Lead Channel Pacing Threshold Amplitude: 1 V
Lead Channel Pacing Threshold Pulse Width: 0.4 ms
Lead Channel Setting Pacing Amplitude: 2.5 V
Lead Channel Setting Pacing Pulse Width: 0.4 ms
Lead Channel Setting Sensing Sensitivity: 0.6 mV
Pulse Gen Serial Number: 206098

## 2020-06-10 NOTE — Progress Notes (Signed)
Remote ICD transmission.   

## 2020-06-13 ENCOUNTER — Encounter: Payer: Self-pay | Admitting: Internal Medicine

## 2020-06-13 ENCOUNTER — Ambulatory Visit (INDEPENDENT_AMBULATORY_CARE_PROVIDER_SITE_OTHER): Payer: Medicare Other | Admitting: Internal Medicine

## 2020-06-13 VITALS — BP 158/80 | HR 60 | Ht 70.0 in | Wt 188.8 lb

## 2020-06-13 DIAGNOSIS — D6869 Other thrombophilia: Secondary | ICD-10-CM | POA: Diagnosis not present

## 2020-06-13 DIAGNOSIS — I428 Other cardiomyopathies: Secondary | ICD-10-CM | POA: Diagnosis not present

## 2020-06-13 DIAGNOSIS — I255 Ischemic cardiomyopathy: Secondary | ICD-10-CM

## 2020-06-13 DIAGNOSIS — I4821 Permanent atrial fibrillation: Secondary | ICD-10-CM | POA: Diagnosis not present

## 2020-06-13 DIAGNOSIS — I10 Essential (primary) hypertension: Secondary | ICD-10-CM

## 2020-06-13 LAB — CUP PACEART INCLINIC DEVICE CHECK
Date Time Interrogation Session: 20211105105838
HighPow Impedance: 51 Ohm
Implantable Lead Implant Date: 20160531
Implantable Lead Location: 753860
Implantable Lead Model: 296
Implantable Lead Serial Number: 129098
Implantable Pulse Generator Implant Date: 20160531
Lead Channel Impedance Value: 404 Ohm
Lead Channel Pacing Threshold Amplitude: 0.8 V
Lead Channel Pacing Threshold Pulse Width: 0.4 ms
Lead Channel Sensing Intrinsic Amplitude: 6.5 mV
Lead Channel Setting Pacing Amplitude: 2.5 V
Lead Channel Setting Pacing Pulse Width: 0.4 ms
Lead Channel Setting Sensing Sensitivity: 0.6 mV
Pulse Gen Serial Number: 206098

## 2020-06-13 NOTE — Patient Instructions (Addendum)
Medication Instructions:   Your physician recommends that you continue on your current medications as directed. Please refer to the Current Medication list given to you today.  Labwork:  None  Testing/Procedures:  None  Follow-Up:  Your physician recommends that you schedule a follow-up appointment in: 1 year.  Any Other Special Instructions Will Be Listed Below (If Applicable).  If you need a refill on your cardiac medications before your next appointment, please call your pharmacy. 

## 2020-06-13 NOTE — Progress Notes (Signed)
PCP: Eber Hong, MD   Primary EP: Dr Memory Argue is a 85 y.o. male who presents today for routine electrophysiology followup.  Since last being seen in our clinic, the patient reports doing very well.  Today, he denies symptoms of palpitations, chest pain, shortness of breath,  lower extremity edema, dizziness, presyncope, syncope, or ICD shocks.  The patient is otherwise without complaint today.   Past Medical History:  Diagnosis Date  . Allergic rhinitis   . Cardiac defibrillator in place 2016   [place] EL ICD DF4 VR model # [id number], serial [id number]   . Cardiomyopathy (HCC)    LVEF 25-30%  . Chronic atrial fibrillation ([place])   . Chronic systolic heart failure ([place])   . Esophageal polyp   . Essential hypertension   . GERD (gastroesophageal reflux disease)   . History of skin cancer    Squamous cell  . Hyperlipidemia   . Hypothyroidism   . Nonischemic cardiomyopathy (HCC)    Normal coronary arteries at cardiac catheterization 2015  . Osteoarthritis   . Renal calculi   . Type 2 diabetes mellitus ([place])   . Vitamin D deficiency    Past Surgical History:  Procedure Laterality Date  . BIOPSY  03/14/2020   Procedure: BIOPSY;  Surgeon: Harvel Quale, MD;  [facility];  Service: Gastroenterology;;  duodenal  . CARDIAC CATHETERIZATION  09/2013  . CARDIAC DEFIBRILLATOR PLACEMENT  01/07/2015   [employer] VR ICD implanted by [employer] in [place]  . CATARACT EXTRACTION    . COLONOSCOPY    . COLONOSCOPY WITH PROPOFOL N/A 03/14/2020   Procedure: COLONOSCOPY WITH PROPOFOL;  Surgeon: Harvel Quale, MD;  [facility];  Service: Gastroenterology;  Laterality: N/A;  815  . ESOPHAGOGASTRODUODENOSCOPY    . ESOPHAGOGASTRODUODENOSCOPY (EGD) WITH PROPOFOL N/A 03/14/2020   Procedure: ESOPHAGOGASTRODUODENOSCOPY (EGD) WITH PROPOFOL;  Surgeon: Harvel Quale, MD;  [facility];  Service:  Gastroenterology;  Laterality: N/A;  . POLYPECTOMY  03/14/2020   Procedure: POLYPECTOMY;  Surgeon: Harvel Quale, MD;  [facility];  Service: Gastroenterology;;  gastric, duodenal,ascending  . SKIN CANCER REMOVAL     . TONSILLECTOMY      ROS- all systems are reviewed and negative except as per HPI above  Current Outpatient Medications  Medication Sig Dispense Refill  . acetaminophen (TYLENOL) 500 MG tablet Take 500 mg by mouth every 6 (six) hours as needed for mild pain or headache.    Marland Kitchen amLODipine (NORVASC) 2.5 MG tablet Take 2.5 mg by mouth in the morning and at bedtime.     . cetirizine (ZYRTEC) 10 MG tablet Take 10 mg by mouth daily.     . Cholecalciferol (VITAMIN D-3) 25 MCG (1000 UT) CAPS Take 1,000 Units by mouth daily.    . cromolyn (NASALCROM) 5.2 MG/ACT nasal spray Place 1 spray into both nostrils daily as needed for allergies or rhinitis.    . Cyanocobalamin 1000 MCG/ML KIT Inject 1,000 mcg as directed every 30 (thirty) days.    Marland Kitchen gabapentin (NEURONTIN) 300 MG capsule Take 300 mg by mouth 2 (two) times daily.    . irbesartan (AVAPRO) 150 MG tablet TAKE 1 TABLET EVERY DAY (Patient taking differently: Take 150 mg by mouth at bedtime. ) 90 tablet 3  . levothyroxine (SYNTHROID, LEVOTHROID) 25 MCG tablet Take 25 mcg by mouth daily before breakfast.    . metoprolol succinate (TOPROL-XL) 100 MG 24 hr tablet  TAKE 1 TABLET TWICE DAILY (Patient taking differently: Take 100 mg by mouth 2 (two) times daily. ) 180 tablet 3  . omeprazole (PRILOSEC) 20 MG capsule Take 20 mg by mouth every Monday, Wednesday, and Friday.     Marland Kitchen VITRON-C 65-125 MG TABS Take 1 tablet by mouth 2 (two) times daily.    Alveda Reasons 15 MG TABS tablet TAKE 1 TABLET DAILY WITH   SUPPER 90 tablet 2   No current facility-administered medications for this visit.    Physical Exam: Vitals:   06/13/20 1046  BP: (!) 158/80  Pulse: 60  SpO2: 98%  Weight: 188 lb 12.8 oz (85.6 kg)  Height: '5\' 10"'  (1.778  m)    GEN- The patient is well appearing, alert and oriented x 3 today.   Head- normocephalic, atraumatic Eyes-  Sclera clear, conjunctiva pink Ears- hearing intact Oropharynx- clear Lungs-  normal work of breathing Chest- ICD pocket is well healed Heart- irregular rate and rhythm  GI- soft  Extremities- no clubbing, cyanosis, or edema  ICD interrogation- reviewed in detail today,  See PACEART report    Wt Readings from Last 3 Encounters:  06/13/20 188 lb 12.8 oz (85.6 kg)  03/31/20 187 lb (84.8 kg)  02/19/20 184 lb (83.5 kg)    Assessment and Plan:  1.  Chronic systolic dysfunction/ nonischemic CM euvolemic today Stable on an appropriate medical regimen Normal ICD function See [facility] report No changes today he is not device dependant today  2. Permanent afib Rate controlled On xarelto for stroke prevention (chads2vasc score is 5)  3. HTN Stable No change required today  Risks, benefits and potential toxicities for medications prescribed and/or refilled reviewed with patient today.   Return in a year  Thompson Grayer MD, [facility] 06/13/2020 11:17 AM

## 2020-06-19 ENCOUNTER — Other Ambulatory Visit: Payer: Self-pay

## 2020-06-19 ENCOUNTER — Encounter (HOSPITAL_COMMUNITY): Payer: Self-pay | Admitting: *Deleted

## 2020-06-19 ENCOUNTER — Emergency Department (HOSPITAL_COMMUNITY): Payer: Medicare Other

## 2020-06-19 ENCOUNTER — Emergency Department (HOSPITAL_COMMUNITY)
Admission: EM | Admit: 2020-06-19 | Discharge: 2020-06-19 | Disposition: A | Discharge status: Home / Self Care | Payer: Medicare Other | Attending: Emergency Medicine | Admitting: Emergency Medicine

## 2020-06-19 DIAGNOSIS — R112 Nausea with vomiting, unspecified: Secondary | ICD-10-CM | POA: Diagnosis present

## 2020-06-19 DIAGNOSIS — Z95810 Presence of automatic (implantable) cardiac defibrillator: Secondary | ICD-10-CM | POA: Insufficient documentation

## 2020-06-19 DIAGNOSIS — R1013 Epigastric pain: Secondary | ICD-10-CM | POA: Diagnosis not present

## 2020-06-19 DIAGNOSIS — E785 Hyperlipidemia, unspecified: Secondary | ICD-10-CM | POA: Diagnosis not present

## 2020-06-19 DIAGNOSIS — Z87891 Personal history of nicotine dependence: Secondary | ICD-10-CM | POA: Insufficient documentation

## 2020-06-19 DIAGNOSIS — E039 Hypothyroidism, unspecified: Secondary | ICD-10-CM | POA: Diagnosis not present

## 2020-06-19 DIAGNOSIS — E1169 Type 2 diabetes mellitus with other specified complication: Secondary | ICD-10-CM | POA: Insufficient documentation

## 2020-06-19 DIAGNOSIS — Z85828 Personal history of other malignant neoplasm of skin: Secondary | ICD-10-CM | POA: Diagnosis not present

## 2020-06-19 LAB — COMPREHENSIVE METABOLIC PANEL
ALT: 17 U/L (ref 0–44)
AST: 24 U/L (ref 15–41)
Albumin: 4.4 g/dL (ref 3.5–5.0)
Alkaline Phosphatase: 63 U/L (ref 38–126)
Anion gap: 11 (ref 5–15)
BUN: 21 mg/dL (ref 8–23)
CO2: 22 mmol/L (ref 22–32)
Calcium: 9.4 mg/dL (ref 8.9–10.3)
Chloride: 104 mmol/L (ref 98–111)
Creatinine, Ser: 1.69 mg/dL — ABNORMAL HIGH (ref 0.61–1.24)
GFR, Estimated: 40 mL/min — ABNORMAL LOW (ref >60)
Glucose, Bld: 154 mg/dL — ABNORMAL HIGH (ref 70–99)
Potassium: 4 mmol/L (ref 3.5–5.1)
Sodium: 137 mmol/L (ref 135–145)
Total Bilirubin: 1 mg/dL (ref 0.3–1.2)
Total Protein: 7.4 g/dL (ref 6.5–8.1)

## 2020-06-19 LAB — CBC WITH DIFFERENTIAL/PLATELET
Abs Immature Granulocytes: 0.05 10*3/uL (ref 0.00–0.07)
Basophils Absolute: 0.1 10*3/uL (ref 0.0–0.1)
Basophils Relative: 1 %
Eosinophils Absolute: 0 10*3/uL (ref 0.0–0.5)
Eosinophils Relative: 0 %
HCT: 46.7 % (ref 39.0–52.0)
Hemoglobin: 14.2 g/dL (ref 13.0–17.0)
Immature Granulocytes: 0 %
Lymphocytes Relative: 11 %
Lymphs Abs: 1.4 10*3/uL (ref 0.7–4.0)
MCH: 23.8 pg — ABNORMAL LOW (ref 26.0–34.0)
MCHC: 30.4 g/dL (ref 30.0–36.0)
MCV: 78.4 fL — ABNORMAL LOW (ref 80.0–100.0)
Monocytes Absolute: 0.9 10*3/uL (ref 0.1–1.0)
Monocytes Relative: 7 %
Neutro Abs: 10.2 10*3/uL — ABNORMAL HIGH (ref 1.7–7.7)
Neutrophils Relative %: 81 %
Platelets: 319 10*3/uL (ref 150–400)
RBC: 5.96 MIL/uL — ABNORMAL HIGH (ref 4.22–5.81)
WBC: 12.7 10*3/uL — ABNORMAL HIGH (ref 4.0–10.5)
nRBC: 0 % (ref 0.0–0.2)

## 2020-06-19 LAB — TROPONIN I (HIGH SENSITIVITY): Troponin I (High Sensitivity): 2 ng/L (ref <18)

## 2020-06-19 LAB — URINALYSIS, ROUTINE W REFLEX MICROSCOPIC
Bacteria, UA: NONE SEEN
Bilirubin Urine: NEGATIVE
Glucose, UA: NEGATIVE mg/dL
Hgb urine dipstick: NEGATIVE
Ketones, ur: NEGATIVE mg/dL
Leukocytes,Ua: NEGATIVE
Nitrite: NEGATIVE
Protein, ur: 30 mg/dL — AB
Specific Gravity, Urine: 1.013 (ref 1.005–1.030)
pH: 7 (ref 5.0–8.0)

## 2020-06-19 LAB — LIPASE, BLOOD: Lipase: 23 U/L (ref 11–51)

## 2020-06-19 MED ORDER — ONDANSETRON 4 MG PO TBDP: 4.0000 mg | ORAL_TABLET | Freq: Three times a day (TID) | ORAL | 0 refills | Status: DC | PRN

## 2020-06-19 MED ORDER — PANTOPRAZOLE SODIUM 40 MG IV SOLR
40.0000 mg | Freq: Once | INTRAVENOUS | Status: AC
  Administered 2020-06-19: 40 mg via INTRAVENOUS
  Filled 2020-06-19: qty 40

## 2020-06-19 MED ORDER — SODIUM CHLORIDE 0.9 % IV BOLUS
500.0000 mL | Freq: Once | INTRAVENOUS | Status: AC
  Administered 2020-06-19: 500 mL via INTRAVENOUS

## 2020-06-19 MED ORDER — ONDANSETRON HCL 4 MG/2ML IJ SOLN
4.0000 mg | Freq: Once | INTRAMUSCULAR | Status: AC
  Administered 2020-06-19: 4 mg via INTRAVENOUS
  Filled 2020-06-19: qty 2

## 2020-06-19 MED ORDER — IOHEXOL 300 MG/ML  SOLN
75.0000 mL | Freq: Once | INTRAMUSCULAR | Status: AC | PRN
  Administered 2020-06-19: 75 mL via INTRAVENOUS

## 2020-06-19 NOTE — ED Provider Notes (Signed)
Emergency Department Provider Note   I have reviewed the triage vital signs and the nursing notes.   HISTORY  Chief Complaint Emesis   HPI Marvin Sanchez is a 85 y.o. male with PMH reviewed below including nonischemic cardiomyopathy and A-fib presents to the emergency department for evaluation of acute onset nausea and vomiting which began this morning.  Patient states that he ate some pinto beans with "chow chow" yesterday and epigastric pain started then. He had some reflux like pain in this throat area which is family to him. He denies any fever, chills, CP, or SOB. His abdominal pain is epigastric and non-radiating. No other clear modifying factors. Patient denies diarrhea.  Past Medical History:  Diagnosis Date  . Allergic rhinitis   . Cardiac defibrillator in place 2016   [place] EL ICD DF4 VR model # W6997659, serial [id number]   . Cardiomyopathy (HCC)    LVEF 25-30%  . Chronic atrial fibrillation ([place])   . Chronic systolic heart failure ([place])   . Esophageal polyp   . Essential hypertension   . GERD (gastroesophageal reflux disease)   . History of skin cancer    Squamous cell  . Hyperlipidemia   . Hypothyroidism   . Nonischemic cardiomyopathy (HCC)    Normal coronary arteries at cardiac catheterization 2015  . Osteoarthritis   . Renal calculi   . Type 2 diabetes mellitus ([place])   . Vitamin D deficiency     Patient Active Problem List   Diagnosis Date Noted  . Anemia 02/19/2020    Past Surgical History:  Procedure Laterality Date  . BIOPSY  03/14/2020   Procedure: BIOPSY;  Surgeon: Harvel Quale, MD;  [facility];  Service: Gastroenterology;;  duodenal  . CARDIAC CATHETERIZATION  09/2013  . CARDIAC DEFIBRILLATOR PLACEMENT  01/07/2015   [employer] VR ICD implanted by [employer] in [place]  . CATARACT EXTRACTION    . COLONOSCOPY    . COLONOSCOPY WITH PROPOFOL N/A 03/14/2020   Procedure: COLONOSCOPY WITH  PROPOFOL;  Surgeon: Harvel Quale, MD;  [facility];  Service: Gastroenterology;  Laterality: N/A;  815  . ESOPHAGOGASTRODUODENOSCOPY    . ESOPHAGOGASTRODUODENOSCOPY (EGD) WITH PROPOFOL N/A 03/14/2020   Procedure: ESOPHAGOGASTRODUODENOSCOPY (EGD) WITH PROPOFOL;  Surgeon: Harvel Quale, MD;  [facility];  Service: Gastroenterology;  Laterality: N/A;  . POLYPECTOMY  03/14/2020   Procedure: POLYPECTOMY;  Surgeon: Harvel Quale, MD;  [facility];  Service: Gastroenterology;;  gastric, duodenal,ascending  . SKIN CANCER REMOVAL     . TONSILLECTOMY      Allergies Hyzaar [losartan potassium-hctz]  Family History  Problem Relation Age of Onset  . Prostate cancer Father   . Heart failure Father   . Stroke Mother   . Diabetes Mellitus II Sister   . Diabetes Mellitus II Brother     Social History Social History   Tobacco Use  . Smoking status: Former Smoker    Types: Cigarettes    Quit date: 08/11/1968    Years since quitting: 51.8  . Smokeless tobacco: Never Used  Substance Use Topics  . Alcohol use: No  . Drug use: No    Review of Systems  Constitutional: No fever/chills Eyes: No visual changes. ENT: No sore throat. Cardiovascular: Denies chest pain. Respiratory: Denies shortness of breath. Gastrointestinal: Positive epigastric abdominal pain. Positive nausea and vomiting.  No diarrhea.  No constipation. Genitourinary: Negative for dysuria. Musculoskeletal: Negative for back pain.  Skin: Negative for rash. Neurological: Negative for headaches, focal weakness or numbness.  10-point ROS otherwise negative.  ____________________________________________   PHYSICAL EXAM:  VITAL SIGNS: ED Triage Vitals  Enc Vitals Group     BP 06/19/20 1704 (!) 153/81     Pulse Rate 06/19/20 1704 (!) 51     Resp 06/19/20 1704 20     Temp 06/19/20 1704 98.1 F (36.7 C)     Temp src --      SpO2 06/19/20 1704 97 %      Weight 06/19/20 1704 188 lb 11.4 oz (85.6 kg)     Height 06/19/20 1704 5\' 10"  (1.778 m)   Constitutional: Alert and oriented. Well appearing and in no acute distress. Eyes: Conjunctivae are normal.  Head: Atraumatic. Nose: No congestion/rhinnorhea. Mouth/Throat: Mucous membranes are moist. Neck: No stridor.   Cardiovascular: Bradycardia. Good peripheral circulation. Grossly normal heart sounds.   Respiratory: Normal respiratory effort.  No retractions. Lungs CTAB. Gastrointestinal: Soft with epigastric and RUQ pain. No distention.  Musculoskeletal: No lower extremity tenderness nor edema. No gross deformities of extremities. Neurologic:  Normal speech and language. Skin:  Skin is warm, dry and intact. No rash noted.   ____________________________________________   LABS (all labs ordered are listed, but only abnormal results are displayed)  Labs Reviewed  COMPREHENSIVE METABOLIC PANEL - Abnormal; Notable for the following components:      Result Value   Glucose, Bld 154 (*)    Creatinine, Ser 1.69 (*)    GFR, Estimated 40 (*)    All other components within normal limits  CBC WITH DIFFERENTIAL/PLATELET - Abnormal; Notable for the following components:   WBC 12.7 (*)    RBC 5.96 (*)    MCV 78.4 (*)    MCH 23.8 (*)    Neutro Abs 10.2 (*)    All other components within normal limits  URINALYSIS, ROUTINE W REFLEX MICROSCOPIC - Abnormal; Notable for the following components:   Color, Urine STRAW (*)    Protein, ur 30 (*)    All other components within normal limits  LIPASE, BLOOD  TROPONIN I (HIGH SENSITIVITY)   ____________________________________________  EKG  Rate: 101 QTc: 474 A-fib. No ST elevation or depression. PVC noted. No STEMI  ____________________________________________  RADIOLOGY  CT ABDOMEN PELVIS W CONTRAST  Result Date: 06/19/2020 CLINICAL DATA:  Nausea and vomiting. EXAM: CT ABDOMEN AND PELVIS WITH CONTRAST TECHNIQUE: Multidetector CT imaging of the  abdomen and pelvis was performed using the standard protocol following bolus administration of intravenous contrast. CONTRAST:  7mL OMNIPAQUE IOHEXOL 300 MG/ML  SOLN COMPARISON:  None. FINDINGS: Lower chest: The lung bases are clear. The heart is enlarged. Hepatobiliary: The liver is normal. Normal gallbladder.There is no biliary ductal dilation. Pancreas: Normal contours without ductal dilatation. No peripancreatic fluid collection. Spleen: Unremarkable. Adrenals/Urinary Tract: --Adrenal glands: Unremarkable. --Right kidney/ureter: No hydronephrosis or radiopaque kidney stones. --Left kidney/ureter: No hydronephrosis or radiopaque kidney stones. --Urinary bladder: Unremarkable. Stomach/Bowel: --Stomach/Duodenum: No hiatal hernia or other gastric abnormality. Normal duodenal course and caliber. --Small bowel: There are few mildly prominent fluid-filled loops of small bowel scattered throughout the abdomen without evidence for high-grade obstruction. --Colon: There are scattered colonic diverticula without CT evidence for diverticulitis. --Appendix: Normal. Vascular/Lymphatic: Atherosclerotic calcification is present within the non-aneurysmal abdominal aorta, without hemodynamically significant stenosis. --No retroperitoneal lymphadenopathy. --No mesenteric lymphadenopathy. --No pelvic or inguinal lymphadenopathy. Reproductive: The prostate gland is enlarged. Other: There is a small amount of free fluid in the patient's abdomen and pelvis,  most notably in the left lower quadrant. There are bilateral fat containing inguinal hernias, right greater than left. Musculoskeletal. No acute displaced fractures. IMPRESSION: 1. There are few mildly prominent fluid-filled loops of small bowel scattered throughout the abdomen without evidence for high-grade obstruction. Findings may be secondary to an enteritis. 2. Small amount of free fluid in the patient's abdomen and pelvis, most notably in the left lower quadrant. 3.  Bilateral fat containing inguinal hernias, right greater than left. 4. Cardiomegaly. 5. Prostatomegaly Aortic Atherosclerosis (ICD10-I70.0). Electronically Signed   By: Constance Holster M.D.   On: 06/19/2020 19:38    ____________________________________________   PROCEDURES  Procedure(s) performed:   Procedures  None ____________________________________________   INITIAL IMPRESSION / ASSESSMENT AND PLAN / ED COURSE  Pertinent labs & imaging results that were available during my care of the patient were reviewed by me and considered in my medical decision making (see chart for details).   Patient presents emergency department with epigastric abdominal pain and vomiting.  Mild tenderness in the epigastric and right upper quadrant regions.  Vitals are overall reassuring.  Lower suspicion for ACS but will obtain EKG and troponin for further evaluation.  Suspect esophagitis/gastritis.  The right upper quadrant has mild tenderness but negative Murphy sign.  Lower suspicion for acute cholecystitis.   CT imaging reviewed. Enteritis findings on CT. Patient feeling much better after IVF and zofran. Meds sent to pharmacy. No findings to suspect gallbladder pathology or atypical ACS.  ____________________________________________  FINAL CLINICAL IMPRESSION(S) / ED DIAGNOSES  Final diagnoses:  Non-intractable vomiting with nausea, unspecified vomiting type  Epigastric pain     MEDICATIONS GIVEN DURING THIS VISIT:  Medications  sodium chloride 0.9 % bolus 500 mL (0 mLs Intravenous Stopped 06/19/20 1952)  pantoprazole (PROTONIX) injection 40 mg (40 mg Intravenous Given 06/19/20 1730)  ondansetron (ZOFRAN) injection 4 mg (4 mg Intravenous Given 06/19/20 1730)  iohexol (OMNIPAQUE) 300 MG/ML solution 75 mL (75 mLs Intravenous Contrast Given 06/19/20 1904)     NEW OUTPATIENT MEDICATIONS STARTED DURING THIS VISIT:  Discharge Medication List as of 06/19/2020  8:50 PM    START taking these  medications   Details  ondansetron (ZOFRAN ODT) 4 MG disintegrating tablet Take 1 tablet (4 mg total) by mouth every 8 (eight) hours as needed., Starting Thu 06/19/2020, Normal        Note:  This document was prepared using Dragon voice recognition software and may include unintentional dictation errors.  Nanda Quinton, MD, [facility]    Vance Hochmuth, [facility], MD 06/20/20 [phone]

## 2020-06-19 NOTE — ED Triage Notes (Signed)
Vomiting onset this am. Abdominal pain

## 2020-06-19 NOTE — Discharge Instructions (Signed)

## 2020-08-30 ENCOUNTER — Other Ambulatory Visit: Payer: Self-pay | Admitting: Cardiology

## 2020-08-30 LAB — CBC WITH DIFFERENTIAL/PLATELET
Absolute Monocytes: 874 cells/uL (ref 200–950)
Basophils Absolute: 56 cells/uL (ref 0–200)
Basophils Relative: 0.5 %
Eosinophils Absolute: 168 cells/uL (ref 15–500)
Eosinophils Relative: 1.5 %
HCT: 51.9 % — ABNORMAL HIGH (ref 38.5–50.0)
Hemoglobin: 16.9 g/dL (ref 13.2–17.1)
Lymphs Abs: 1994 cells/uL (ref 850–3900)
MCH: 28 pg (ref 27.0–33.0)
MCHC: 32.6 g/dL (ref 32.0–36.0)
MCV: 85.9 fL (ref 80.0–100.0)
MPV: 11.7 fL (ref 7.5–12.5)
Monocytes Relative: 7.8 %
Neutro Abs: 8109 cells/uL — ABNORMAL HIGH (ref 1500–7800)
Neutrophils Relative %: 72.4 %
Platelets: 243 10*3/uL (ref 140–400)
RBC: 6.04 10*6/uL — ABNORMAL HIGH (ref 4.20–5.80)
RDW: 17 % — ABNORMAL HIGH (ref 11.0–15.0)
Total Lymphocyte: 17.8 %
WBC: 11.2 10*3/uL — ABNORMAL HIGH (ref 3.8–10.8)

## 2020-08-30 LAB — BASIC METABOLIC PANEL WITH GFR
BUN/Creatinine Ratio: 14 (calc) (ref 6–22)
BUN: 24 mg/dL (ref 7–25)
CO2: 24 mmol/L (ref 20–32)
Calcium: 9.9 mg/dL (ref 8.6–10.3)
Chloride: 104 mmol/L (ref 98–110)
Creat: 1.76 mg/dL — ABNORMAL HIGH (ref 0.70–1.11)
GFR, Est African American: 41 mL/min/{1.73_m2} — ABNORMAL LOW (ref >=60)
GFR, Est Non African American: 35 mL/min/{1.73_m2} — ABNORMAL LOW (ref >=60)
Glucose, Bld: 109 mg/dL — ABNORMAL HIGH (ref 65–99)
Potassium: 4.4 mmol/L (ref 3.5–5.3)
Sodium: 140 mmol/L (ref 135–146)

## 2020-09-01 ENCOUNTER — Telehealth: Payer: Self-pay | Admitting: *Deleted

## 2020-09-01 NOTE — Telephone Encounter (Signed)
-----   Message from Satira Sark, MD sent at 09/01/2020 11:17 AM EST ----- Results reviewed.  Creatinine 1.76 is relatively stable, hemoglobin normal at 16.9.  No change in current dose of Xarelto.

## 2020-09-01 NOTE — Telephone Encounter (Signed)
Patient informed. Copy sent to PCP °

## 2020-09-05 ENCOUNTER — Ambulatory Visit (INDEPENDENT_AMBULATORY_CARE_PROVIDER_SITE_OTHER): Payer: Medicare Other

## 2020-09-05 DIAGNOSIS — I255 Ischemic cardiomyopathy: Secondary | ICD-10-CM

## 2020-09-05 LAB — CUP PACEART REMOTE DEVICE CHECK
Battery Remaining Longevity: 138 mo
Battery Remaining Percentage: 100 %
Brady Statistic RV Percent Paced: 2 %
Date Time Interrogation Session: 20220128005100
HighPow Impedance: 47 Ohm
Implantable Lead Implant Date: 20160531
Implantable Lead Location: 753860
Implantable Lead Model: 296
Implantable Lead Serial Number: 129098
Implantable Pulse Generator Implant Date: 20160531
Lead Channel Impedance Value: 421 Ohm
Lead Channel Pacing Threshold Amplitude: 0.8 V
Lead Channel Pacing Threshold Pulse Width: 0.4 ms
Lead Channel Setting Pacing Amplitude: 2.5 V
Lead Channel Setting Pacing Pulse Width: 0.4 ms
Lead Channel Setting Sensing Sensitivity: 0.6 mV
Pulse Gen Serial Number: 206098

## 2020-09-13 NOTE — Progress Notes (Signed)
Remote ICD transmission.   

## 2020-10-06 NOTE — Progress Notes (Signed)
Cardiology Office Note  Date: 10/07/2020   ID: Marvin Sanchez, DOB 02-25-1935, MRN [id number]  PCP:  Eber Hong, MD  Cardiologist:  Rozann Lesches, MD Electrophysiologist:  Thompson Grayer, MD   Chief Complaint  Patient presents with  . Cardiac follow-up    History of Present Illness: Marvin Sanchez is an 86 y.o. male last seen in August 2021.  He presents for a routine visit.  Reports no sense of palpitations or chest pain.  Has not been as active over the winter, but still gets outside to walk his dog about 1/2 mile at a time each day.  He sees Dr. Rayann Heman, [place] ICD in place.  Most recent device check showed normal function.  He does not report any device shocks or syncope.  I reviewed his medications which are stable from a cardiac perspective and outlined below.  He does not report any spontaneous bleeding problems on Xarelto.  I reviewed his lab work from January as well.  Past Medical History:  Diagnosis Date  . Allergic rhinitis   . Cardiac defibrillator in place 2016   [place] EL ICD DF4 VR model # [id number], serial [id number]   . Cardiomyopathy (HCC)    LVEF 25-30%  . Chronic atrial fibrillation ([place])   . Chronic systolic heart failure ([place])   . Esophageal polyp   . Essential hypertension   . GERD (gastroesophageal reflux disease)   . History of skin cancer    Squamous cell  . Hyperlipidemia   . Hypothyroidism   . Nonischemic cardiomyopathy (HCC)    Normal coronary arteries at cardiac catheterization 2015  . Osteoarthritis   . Renal calculi   . Type 2 diabetes mellitus ([place])   . Vitamin D deficiency     Past Surgical History:  Procedure Laterality Date  . BIOPSY  03/14/2020   Procedure: BIOPSY;  Surgeon: Harvel Quale, MD;  [facility];  Service: Gastroenterology;;  duodenal  . CARDIAC CATHETERIZATION  09/2013  . CARDIAC DEFIBRILLATOR PLACEMENT  01/07/2015   [employer] VR ICD implanted by [employer] in [place]  . CATARACT EXTRACTION    . COLONOSCOPY    . COLONOSCOPY WITH PROPOFOL N/A 03/14/2020   Procedure: COLONOSCOPY WITH PROPOFOL;  Surgeon: Harvel Quale, MD;  [facility];  Service: Gastroenterology;  Laterality: N/A;  815  . ESOPHAGOGASTRODUODENOSCOPY    . ESOPHAGOGASTRODUODENOSCOPY (EGD) WITH PROPOFOL N/A 03/14/2020   Procedure: ESOPHAGOGASTRODUODENOSCOPY (EGD) WITH PROPOFOL;  Surgeon: Harvel Quale, MD;  [facility];  Service: Gastroenterology;  Laterality: N/A;  . POLYPECTOMY  03/14/2020   Procedure: POLYPECTOMY;  Surgeon: Harvel Quale, MD;  [facility];  Service: Gastroenterology;;  gastric, duodenal,ascending  . SKIN CANCER REMOVAL     . TONSILLECTOMY      Current Outpatient Medications  Medication Sig Dispense Refill  . acetaminophen (TYLENOL) 500 MG tablet Take 500 mg by mouth every 6 (six) hours as needed for mild pain or headache.    Marland Kitchen amLODipine (NORVASC) 2.5 MG tablet Take 2.5 mg by mouth in the morning and at bedtime.     . cetirizine (ZYRTEC) 10 MG tablet Take 10 mg by mouth daily.     . Cholecalciferol (VITAMIN D-3) 25 MCG (1000 UT) CAPS Take 1,000 Units by mouth daily.    . cromolyn (NASALCROM) 5.2 MG/ACT nasal spray Place 1 spray into both nostrils daily as needed for allergies or rhinitis.    Marland Kitchen  Cyanocobalamin 1000 MCG/ML KIT Inject 1,000 mcg as directed every 30 (thirty) days.    Marland Kitchen gabapentin (NEURONTIN) 300 MG capsule Take 300 mg by mouth 2 (two) times daily.    . irbesartan (AVAPRO) 150 MG tablet TAKE 1 TABLET EVERY DAY (Patient taking differently: Take 150 mg by mouth at bedtime.) 90 tablet 3  . levothyroxine (SYNTHROID, LEVOTHROID) 25 MCG tablet Take 25 mcg by mouth daily before breakfast.    . metoprolol succinate (TOPROL-XL) 100 MG 24 hr tablet TAKE 1 TABLET TWICE DAILY (Patient taking differently: Take 100 mg by mouth 2 (two) times daily.) 180 tablet 3  . omeprazole (PRILOSEC) 20  MG capsule Take 20 mg by mouth every Monday, Wednesday, and Friday.    Marland Kitchen VITRON-C 65-125 MG TABS Take 1 tablet by mouth 2 (two) times daily.    Alveda Reasons 15 MG TABS tablet TAKE 1 TABLET DAILY WITH   SUPPER 90 tablet 2   No current facility-administered medications for this visit.   Allergies:  Hyzaar [losartan potassium-hctz]   ROS: No palpitations or syncope.  Physical Exam: VS:  BP 132/74   Pulse (!) 52   Ht '5\' 10"'  (1.778 m)   Wt 192 lb (87.1 kg)   SpO2 96%   BMI 27.55 kg/m , BMI Body mass index is 27.55 kg/m.  Wt Readings from Last 3 Encounters:  10/07/20 192 lb (87.1 kg)  06/19/20 188 lb 11.4 oz (85.6 kg)  06/13/20 188 lb 12.8 oz (85.6 kg)    General: Elderly male, appears comfortable at rest. HEENT: Conjunctiva and lids normal, wearing a mask. Neck: Supple, no elevated JVP or carotid bruits, no thyromegaly. Lungs: Clear to auscultation, nonlabored breathing at rest. Cardiac: Irregularly irregular, no S3 or significant systolic murmur, no pericardial rub. Extremities: No pitting edema.  ECG:  An ECG dated 06/19/2020 was personally reviewed today and demonstrated:  Atrial fibrillation with PVC, poor R wave progression rule out old anterior infarct pattern.  Left anterior fascicular block.  Recent Labwork: 02/03/2020: TSH 4.401 06/19/2020: ALT 17; AST 24 08/30/2020: BUN 24; Creat 1.76; Hemoglobin 16.9; Platelets 243; Potassium 4.4; Sodium 140   Other Studies Reviewed Today:  Echocardiogram 09/20/2018: 1. The left ventricle has a visually estimated ejection fraction of  40%. The cavity size was normal. There is mildly increased left  ventricular wall thickness. Left ventricular diastology could not be  evaluated secondary to atrial fibrillation.  Elevated left ventricular end-diastolic pressure Left ventricular diffuse  hypokinesis.  2. The right ventricle has moderately reduced systolic function. The  cavity was normal. There is no increase in right ventricular wall   thickness.  3. Left atrial size was severely dilated.  4. Right atrial size was mildly dilated.  5. The mitral valve is normal in structure.  6. The tricuspid valve is normal in structure.  7. The aortic valve is tricuspid.  8. The aortic root is normal in size and structure.  9. Right atrial pressure is estimated at 10 mmHg.  Assessment and Plan:  1.  Nonischemic cardiomyopathy, LVEF 40% by echocardiogram in 2020.  We discussed obtaining a follow-up study for reevaluation.  He has been clinically stable on Toprol-XL and Avapro, no diuretic requirement with stable weight over time.  2.  [employer] ICD in place, no device shocks or syncope.  Keep follow-up with Dr. Rayann Heman.  3.  Permanent atrial fibrillation, CHA2DS2-VASc score is 5.  He is on Xarelto 15 mg daily, recent lab work reviewed with stable renal function.  4.  CKD stage IIIb, creatinine 1.76.  Medication Adjustments/Labs and Tests Ordered: Current medicines are reviewed at length with the patient today.  Concerns regarding medicines are outlined above.   Tests Ordered: Orders Placed This Encounter  Procedures  . ECHOCARDIOGRAM COMPLETE    Medication Changes: No orders of the defined types were placed in this encounter.   Disposition:  Follow up 6 months in the [place] office.  Signed, Satira Sark, MD, [facility] 10/07/2020 10:34 AM    [facility] [address], [place], [place] [address] Phone: [phone]; Fax: [phone]

## 2020-10-07 ENCOUNTER — Ambulatory Visit (INDEPENDENT_AMBULATORY_CARE_PROVIDER_SITE_OTHER): Payer: Medicare Other | Admitting: Cardiology

## 2020-10-07 ENCOUNTER — Encounter: Payer: Self-pay | Admitting: Cardiology

## 2020-10-07 VITALS — BP 132/74 | HR 52 | Ht 70.0 in | Wt 192.0 lb

## 2020-10-07 DIAGNOSIS — I4821 Permanent atrial fibrillation: Secondary | ICD-10-CM | POA: Diagnosis not present

## 2020-10-07 DIAGNOSIS — I255 Ischemic cardiomyopathy: Secondary | ICD-10-CM | POA: Diagnosis not present

## 2020-10-07 DIAGNOSIS — N1832 Chronic kidney disease, stage 3b: Secondary | ICD-10-CM

## 2020-10-07 DIAGNOSIS — I428 Other cardiomyopathies: Secondary | ICD-10-CM | POA: Diagnosis not present

## 2020-10-07 NOTE — Patient Instructions (Addendum)

## 2020-11-06 ENCOUNTER — Ambulatory Visit (INDEPENDENT_AMBULATORY_CARE_PROVIDER_SITE_OTHER): Payer: Medicare Other

## 2020-11-06 ENCOUNTER — Other Ambulatory Visit: Payer: Self-pay | Admitting: *Deleted

## 2020-11-06 ENCOUNTER — Telehealth: Payer: Self-pay | Admitting: Cardiology

## 2020-11-06 DIAGNOSIS — I428 Other cardiomyopathies: Secondary | ICD-10-CM | POA: Diagnosis not present

## 2020-11-06 LAB — ECHOCARDIOGRAM COMPLETE
AV Vena cont: 0.23 cm
Area-P 1/2: 4.35 cm2
Calc EF: 26.2 %
MV M vel: 4.63 m/s
MV Peak grad: 85.7 mmHg
P 1/2 time: 1024 msec
S' Lateral: 4.97 cm
Single Plane A2C EF: 29.3 %
Single Plane A4C EF: 25 %

## 2020-11-06 MED ORDER — RIVAROXABAN 15 MG PO TABS: ORAL_TABLET | ORAL | 2 refills | Status: DC

## 2020-11-06 NOTE — Telephone Encounter (Signed)
*  STAT* If patient is at the pharmacy, call can be transferred to refill team.   1. Which medications need to be refilled? (please list name of each medication and dose if known)  XARELTO 15 MG TABS tablet [[id number]]   2. Which pharmacy/location (including street and city if local pharmacy) is medication to be sent to? [facility]    3. Do they need a 30 day or 90 day supply?  [place]

## 2020-11-06 NOTE — Telephone Encounter (Signed)
Prescription refill request for Xarelto received.  Indication: PAF Last office visit: 10/07/20 Weight: 87.1kg Age: 86 Scr: 1.76 on 07/30/21 CrCl: 39.18  Based on above findings Xarelto 15mg  daily (renal dose) is the appropriate dose.  Refill approved.

## 2020-11-06 NOTE — Addendum Note (Signed)
Addended by: Malen Gauze on: 11/06/2020 12:51 PM   Modules accepted: Orders

## 2020-11-07 ENCOUNTER — Telehealth: Payer: Self-pay | Admitting: *Deleted

## 2020-11-07 DIAGNOSIS — I10 Essential (primary) hypertension: Secondary | ICD-10-CM

## 2020-11-07 MED ORDER — ENTRESTO 24-26 MG PO TABS: 1.0000 | ORAL_TABLET | Freq: Two times a day (BID) | ORAL | 3 refills | Status: DC

## 2020-11-07 NOTE — Telephone Encounter (Signed)
Pt voice understanding and agreeable to med change - will mail lab orders as he goes to [facility] in [place], [place] for lab work - updated medication list

## 2020-11-07 NOTE — Telephone Encounter (Signed)
-----   Message from Satira Sark, MD sent at 11/06/2020  1:28 PM EDT ----- Results reviewed.  LVEF 30 to 35%, less than calculated by previous echocardiogram.  He was clinically stable at recent office visit.  If he is willing, would suggest switching him from Avapro to [place] starting at 24/26 mg twice daily.  We can uptitrate from there if tolerated.  Needs to recheck BMET in 7 to 10 days after starting Entresto.

## 2020-11-13 NOTE — Telephone Encounter (Signed)
Please fax lab orders to [phone] to [facility] in [place] - pt has an apt on 4/13

## 2020-11-13 NOTE — Telephone Encounter (Signed)
Done

## 2020-12-05 ENCOUNTER — Ambulatory Visit (INDEPENDENT_AMBULATORY_CARE_PROVIDER_SITE_OTHER): Payer: Medicare Other

## 2020-12-05 DIAGNOSIS — I428 Other cardiomyopathies: Secondary | ICD-10-CM

## 2020-12-05 DIAGNOSIS — I4821 Permanent atrial fibrillation: Secondary | ICD-10-CM

## 2020-12-05 LAB — CUP PACEART REMOTE DEVICE CHECK
Battery Remaining Longevity: 138 mo
Battery Remaining Percentage: 100 %
Brady Statistic RV Percent Paced: 2 %
Date Time Interrogation Session: 20220429005100
HighPow Impedance: 45 Ohm
Implantable Lead Implant Date: 20160531
Implantable Lead Location: 753860
Implantable Lead Model: 296
Implantable Lead Serial Number: 129098
Implantable Pulse Generator Implant Date: 20160531
Lead Channel Impedance Value: 392 Ohm
Lead Channel Pacing Threshold Amplitude: 0.8 V
Lead Channel Pacing Threshold Pulse Width: 0.4 ms
Lead Channel Setting Pacing Amplitude: 2.5 V
Lead Channel Setting Pacing Pulse Width: 0.4 ms
Lead Channel Setting Sensing Sensitivity: 0.6 mV
Pulse Gen Serial Number: 206098

## 2020-12-25 NOTE — Progress Notes (Signed)
Remote ICD transmission.   

## 2021-02-04 ENCOUNTER — Telehealth: Payer: Self-pay | Admitting: Cardiology

## 2021-02-04 MED ORDER — FUROSEMIDE 20 MG PO TABS: 20.0000 mg | ORAL_TABLET | Freq: Every day | ORAL | 3 refills | Status: DC

## 2021-02-04 NOTE — Telephone Encounter (Signed)
Per phone call from pt's wife- she states pt is having a hard time breathing at night  States it's not constant, but several times a night he's having to sit up in the bed cause he's having a hard time breathing and it only happens when he's laying down.   [phone]

## 2021-02-04 NOTE — Telephone Encounter (Signed)
Wife notified of Dr. Myles Gip recommendations. Order placed.

## 2021-02-04 NOTE — Telephone Encounter (Signed)
Spoke with wife who states that pt has SOB through out the night on some nights. She states that when she wakes that he is sitting up in the bed stating that he can not breathe. Wife denies edema or weight gain. Pt's current weight is 190lbs, and BP is 126/82. Please advise.

## 2021-02-28 ENCOUNTER — Other Ambulatory Visit: Payer: Self-pay | Admitting: Cardiology

## 2021-03-06 ENCOUNTER — Ambulatory Visit (INDEPENDENT_AMBULATORY_CARE_PROVIDER_SITE_OTHER): Payer: Medicare Other

## 2021-03-06 DIAGNOSIS — I255 Ischemic cardiomyopathy: Secondary | ICD-10-CM | POA: Diagnosis not present

## 2021-03-07 LAB — CUP PACEART REMOTE DEVICE CHECK
Battery Remaining Longevity: 132 mo
Battery Remaining Percentage: 100 %
Brady Statistic RV Percent Paced: 2 %
Date Time Interrogation Session: 20220729005100
HighPow Impedance: 48 Ohm
Implantable Lead Implant Date: 20160531
Implantable Lead Location: 753860
Implantable Lead Model: 296
Implantable Lead Serial Number: 129098
Implantable Pulse Generator Implant Date: 20160531
Lead Channel Impedance Value: 403 Ohm
Lead Channel Pacing Threshold Amplitude: 0.8 V
Lead Channel Pacing Threshold Pulse Width: 0.4 ms
Lead Channel Setting Pacing Amplitude: 2.5 V
Lead Channel Setting Pacing Pulse Width: 0.4 ms
Lead Channel Setting Sensing Sensitivity: 0.6 mV
Pulse Gen Serial Number: 206098

## 2021-03-09 NOTE — Progress Notes (Addendum)
Cardiology Office Note  Date: 03/10/2021   ID: Marvin Sanchez, DOB Apr 30, 1935, MRN [id number]  PCP:  Eber Hong, MD  Cardiologist:  Rozann Lesches, MD Electrophysiologist:  Thompson Grayer, MD   Chief Complaint:   History of Present Illness: Marvin Sanchez is a 85 y.o. male with a history of chronic atrial fibrillation, chronic systolic heart failure, HTN, GERD, AICD in place, nonischemic cardiomyopathy, DM2, hyperlipidemia, hypothyroidism.  He was last seen by Dr. Domenic Polite on 10/07/2020.  He reported no sense of palpitations or chest pain he was following Dr. Rayann Heman for [place] in place.  His most recent device check showed normal function.  He did not report any device shocks or syncope.  His cardiac medications were stable from a cardiac perspective.  He did not reported any bleeding on Xarelto.  His LVEF was 20% by echo in 2020.  Follow-up study was ordered for reevaluation.  He was clinically stable on Toprol-XL and Avapro.  His CHA2DS2-VASc score was 5.  He was continuing Xarelto 15 mg daily.  He had stable renal function on recent labs.  Creatinine was 1.76 given his stage IIIb CKD.   He is here for follow-up.  He states he is feeling much better since starting the [facility].  Also taking Lasix due to complaints of nighttime dyspnea.  Denies any current orthopnea or PND.  It appears his weights are staying in the 188 range.  Denies any lower extremity edema.  Denies any palpitations or arrhythmias.  No orthostatic symptoms, denies any bleeding on Xarelto.  He sees Dr. Christa See nephrology in [place] for his CKD 3B.  He had recent remote ICD check on 03/07/2022 which showed normal device function.   Past Medical History:  Diagnosis Date   Allergic rhinitis    Cardiac defibrillator in place 2016   [employer] EL ICD DF4 VR model # [id number], serial [id number]    Cardiomyopathy ([place])    LVEF 25-30%   Chronic atrial fibrillation (HCC)    Chronic systolic heart  failure (HCC)    Esophageal polyp    Essential hypertension    GERD (gastroesophageal reflux disease)    History of skin cancer    Squamous cell   Hyperlipidemia    Hypothyroidism    Nonischemic cardiomyopathy (HCC)    Normal coronary arteries at cardiac catheterization 2015   Osteoarthritis    Renal calculi    Type 2 diabetes mellitus ([place])    Vitamin D deficiency     Past Surgical History:  Procedure Laterality Date   BIOPSY  03/14/2020   Procedure: BIOPSY;  Surgeon: Harvel Quale, MD;  [facility];  Service: Gastroenterology;;  duodenal   CARDIAC CATHETERIZATION  09/2013   CARDIAC DEFIBRILLATOR PLACEMENT  01/07/2015   [employer] Dynagen VR ICD implanted by [employer] physicians in [place] PROPOFOL N/A 03/14/2020   Procedure: COLONOSCOPY WITH PROPOFOL;  Surgeon: Harvel Quale, MD;  Location: [facility];  Service: Gastroenterology;  Laterality: N/A;  815   ESOPHAGOGASTRODUODENOSCOPY     ESOPHAGOGASTRODUODENOSCOPY (EGD) WITH PROPOFOL N/A 03/14/2020   Procedure: ESOPHAGOGASTRODUODENOSCOPY (EGD) WITH PROPOFOL;  Surgeon: Harvel Quale, MD;  [facility];  Service: Gastroenterology;  Laterality: N/A;   POLYPECTOMY  03/14/2020   Procedure: POLYPECTOMY;  Surgeon: Harvel Quale, MD;  Location: [facility];  Service: Gastroenterology;;  gastric, duodenal,ascending   SKIN CANCER  REMOVAL      TONSILLECTOMY      Current Outpatient Medications  Medication Sig Dispense Refill   acetaminophen (TYLENOL) 500 MG tablet Take 500 mg by mouth every 6 (six) hours as needed for mild pain or headache.     amLODipine (NORVASC) 2.5 MG tablet Take 2.5 mg by mouth in the morning and at bedtime.      cetirizine (ZYRTEC) 10 MG tablet Take 10 mg by mouth daily.      Cholecalciferol (VITAMIN D-3) 25 MCG (1000 UT) CAPS Take 1,000 Units by mouth daily.     cromolyn (NASALCROM)  5.2 MG/ACT nasal spray Place 1 spray into both nostrils daily as needed for allergies or rhinitis.     Cyanocobalamin 1000 MCG/ML KIT Inject 1,000 mcg as directed every 30 (thirty) days.     ENTRESTO 24-26 MG Take 1 tablet by mouth twice daily 60 tablet 5   furosemide (LASIX) 20 MG tablet Take 1 tablet (20 mg total) by mouth daily. 90 tablet 3   gabapentin (NEURONTIN) 300 MG capsule Take 300 mg by mouth 2 (two) times daily.     levothyroxine (SYNTHROID, LEVOTHROID) 25 MCG tablet Take 25 mcg by mouth daily before breakfast.     metoprolol succinate (TOPROL-XL) 100 MG 24 hr tablet TAKE 1 TABLET TWICE DAILY (Patient taking differently: Take 100 mg by mouth 2 (two) times daily.) 180 tablet 3   omeprazole (PRILOSEC) 20 MG capsule Take 20 mg by mouth every Monday, Wednesday, and Friday.     VITRON-C 65-125 MG TABS Take 1 tablet by mouth 2 (two) times daily.     Rivaroxaban (XARELTO) 15 MG TABS tablet TAKE 1 TABLET DAILY WITH   SUPPER 90 tablet 3   No current facility-administered medications for this visit.   Allergies:  Hyzaar [losartan potassium-hctz]   Social History: The patient  reports that he quit smoking about 52 years ago. His smoking use included cigarettes. He has never used smokeless tobacco. He reports that he does not drink alcohol and does not use drugs.   Family History: The patient's family history includes Diabetes Mellitus II in his brother and sister; Heart failure in his father; Prostate cancer in his father; Stroke in his mother.   ROS:  Please see the history of present illness. Otherwise, complete review of systems is positive for none.  All other systems are reviewed and negative.   Physical Exam: VS:  BP 130/84   Pulse 68   Ht '5\' 10"'  (1.778 m)   Wt 188 lb 12.8 oz (85.6 kg)   SpO2 97%   BMI 27.09 kg/m , BMI Body mass index is 27.09 kg/m.  Wt Readings from Last 3 Encounters:  03/10/21 188 lb 12.8 oz (85.6 kg)  10/07/20 192 lb (87.1 kg)  06/19/20 188 lb 11.4 oz  (85.6 kg)    General: Patient appears comfortable at rest. Neck: Supple, no elevated JVP or carotid bruits, no thyromegaly. Lungs: Clear to auscultation, nonlabored breathing at rest. Cardiac: Regular rate and rhythm, no S3 or significant systolic murmur, no pericardial rub. Extremities: No pitting edema, distal pulses 2+. Skin: Warm and dry. Musculoskeletal: No kyphosis. Neuropsychiatric: Alert and oriented x3, affect grossly appropriate.  ECG:    Recent Labwork: 06/19/2020: ALT 17; AST 24 08/30/2020: BUN 24; Creat 1.76; Hemoglobin 16.9; Platelets 243; Potassium 4.4; Sodium 140  No results found for: CHOL, TRIG, HDL, CHOLHDL, VLDL, LDLCALC, LDLDIRECT  Other Studies Reviewed Today:   Echocardiogram 11/06/2020  1. Left ventricular ejection  fraction, by estimation, is 30 to 35%. The left ventricle has moderately decreased function. The left ventricle demonstrates global hypokinesis. The left ventricular internal cavity size was moderately dilated. Left ventricular diastolic parameters are indeterminate. The average left ventricular global longitudinal strain is -6.4 %. The global longitudinal strain is abnormal. 2. Right ventricular systolic function is normal. The right ventricular size is normal. 3. Left atrial size was severely dilated. 4. Right atrial size was severely dilated. 5. The mitral valve is normal in structure. Mild mitral valve regurgitation. No evidence of mitral stenosis. 6. The aortic valve is tricuspid. Aortic valve regurgitation is trivial. No aortic stenosis is present. 7. The inferior vena cava is normal in size with greater than 50% respiratory variability, suggesting right atrial pressure of 3 mmHg. Comparison(s): Echocardiogram done 09/20/18 showed an EF of 40%.    Echocardiogram 09/20/2018:  1. The left ventricle has a visually estimated ejection fraction of  40%. The cavity size was normal. There is mildly increased left  ventricular wall thickness. Left  ventricular diastology could not be  evaluated secondary to atrial fibrillation.  Elevated left ventricular end-diastolic pressure Left ventricular diffuse  hypokinesis.   2. The right ventricle has moderately reduced systolic function. The  cavity was normal. There is no increase in right ventricular wall  thickness.   3. Left atrial size was severely dilated.   4. Right atrial size was mildly dilated.   5. The mitral valve is normal in structure.   6. The tricuspid valve is normal in structure.   7. The aortic valve is tricuspid.   8. The aortic root is normal in size and structure.   9. Right atrial pressure is estimated at 10 mmHg.    Assessment and Plan:  1. Shortness of breath   2. Nonischemic cardiomyopathy ([place])   3. ICD (implantable cardioverter-defibrillator) in place   4. Permanent atrial fibrillation (HCC)   5. Stage 3b chronic kidney disease ([place])    1. Shortness of breath States she is not very active on a daily basis but does have some mild DOE when performing more than usual ADLs.  2. Nonischemic cardiomyopathy ([place]) Last echocardiogram in March showed EF of 30 to 35%.  During that time Delene Loll was started at 24/26 mg p.o. twice daily.  He continues on Lasix 20 mg daily without any significant shortness of breath.  Denies any PND or orthopnea since starting Lasix and Entresto.  Please schedule follow-up echocardiogram to reassess LV function, diastolic function and valvular function.  Patient states he is feeling much better  3. ICD (implantable cardioverter-defibrillator) in place Had a recent ICD check on 03/07/2021 with normal device function.  He follows with Dr. Rayann Heman for device management.  4. Permanent atrial fibrillation ([place]) He denies any palpitations or arrhythmias.  Heart rate today is 68 and irregularly irregular.  Continue Toprol 100 mg p.o. daily.  Continue Xarelto 15 mg p.o. daily.  5. Stage 3b chronic kidney disease ([place]) Creatinine 1.6 and GFR 39  on 10/23/2020 he continues to follow with Dr. Christa See in [place] for renal disease.  6.  Essential hypertension. Blood pressure reasonably well controlled today at 130/84.  Continue amlodipine 2.5 mg daily.  Continue Toprol-XL 100 mg daily.  Continue Lasix 20 mg daily.  Medication Adjustments/Labs and Tests Ordered: Current medicines are reviewed at length with the patient today.  Concerns regarding medicines are outlined above.   Disposition: Follow-up with Dr. Domenic Polite or APP 2 to 3 months  Signed, Mitzi Hansen  Leonides Sake, NP 03/10/2021 3:35 PM    [facility] [place], [place], [place] [address] Phone: [phone]; Fax: [phone]

## 2021-03-10 ENCOUNTER — Encounter: Payer: Self-pay | Admitting: Family Medicine

## 2021-03-10 ENCOUNTER — Ambulatory Visit (INDEPENDENT_AMBULATORY_CARE_PROVIDER_SITE_OTHER): Payer: Medicare Other | Admitting: Family Medicine

## 2021-03-10 VITALS — BP 130/84 | HR 68 | Ht 70.0 in | Wt 188.8 lb

## 2021-03-10 DIAGNOSIS — Z95810 Presence of automatic (implantable) cardiac defibrillator: Secondary | ICD-10-CM | POA: Diagnosis not present

## 2021-03-10 DIAGNOSIS — I428 Other cardiomyopathies: Secondary | ICD-10-CM

## 2021-03-10 DIAGNOSIS — R0602 Shortness of breath: Secondary | ICD-10-CM | POA: Diagnosis not present

## 2021-03-10 DIAGNOSIS — I4821 Permanent atrial fibrillation: Secondary | ICD-10-CM | POA: Diagnosis not present

## 2021-03-10 DIAGNOSIS — N1832 Chronic kidney disease, stage 3b: Secondary | ICD-10-CM

## 2021-03-10 MED ORDER — RIVAROXABAN 15 MG PO TABS: ORAL_TABLET | ORAL | 3 refills | Status: DC

## 2021-03-10 NOTE — Patient Instructions (Signed)
Medication Instructions:  Your physician recommends that you continue on your current medications as directed. Please refer to the Current Medication list given to you today.  Labwork: none  Testing/Procedures: Your physician has requested that you have an echocardiogram. Echocardiography is a painless test that uses sound waves to create images of your heart. It provides your doctor with information about the size and shape of your heart and how well your heart's chambers and valves are working. This procedure takes approximately one hour. There are no restrictions for this procedure.  Follow-Up: Your physician recommends that you schedule a follow-up appointment in: 2-3 months  Any Other Special Instructions Will Be Listed Below (If Applicable).  If you need a refill on your cardiac medications before your next appointment, please call your pharmacy.

## 2021-03-17 ENCOUNTER — Telehealth: Payer: Self-pay | Admitting: *Deleted

## 2021-03-17 NOTE — Telephone Encounter (Addendum)
Per [employer], xarelto denied due to out of pocket expense requirement for Medicare Part D. Patient's total amount is $1,244.00. Patient spent [phone] and need to spend $578.83 to receive assistance.

## 2021-03-23 ENCOUNTER — Other Ambulatory Visit: Payer: Self-pay | Admitting: *Deleted

## 2021-03-23 MED ORDER — ENTRESTO 24-26 MG PO TABS: 1.0000 | ORAL_TABLET | Freq: Two times a day (BID) | ORAL | 3 refills | Status: DC

## 2021-04-01 ENCOUNTER — Encounter: Payer: Self-pay | Admitting: *Deleted

## 2021-04-01 NOTE — Progress Notes (Signed)
Faxed notification received from [employer] that entresto 24/26 approved through 08/08/2021

## 2021-04-01 NOTE — Progress Notes (Signed)
Remote ICD transmission.   

## 2021-04-08 ENCOUNTER — Telehealth: Payer: Self-pay | Admitting: *Deleted

## 2021-04-08 MED ORDER — RIVAROXABAN 15 MG PO TABS: ORAL_TABLET | ORAL | 3 refills | Status: DC

## 2021-04-08 NOTE — Telephone Encounter (Signed)
Fax notification received from [employer] that xarelto 15 mg approved through 08/08/2021. New rx sent to [facility]

## 2021-04-09 ENCOUNTER — Other Ambulatory Visit: Payer: Medicare Other

## 2021-04-15 ENCOUNTER — Ambulatory Visit: Payer: Medicare Other | Admitting: Cardiology

## 2021-05-07 ENCOUNTER — Ambulatory Visit (INDEPENDENT_AMBULATORY_CARE_PROVIDER_SITE_OTHER): Payer: Medicare Other

## 2021-05-07 DIAGNOSIS — I428 Other cardiomyopathies: Secondary | ICD-10-CM | POA: Diagnosis not present

## 2021-05-07 LAB — ECHOCARDIOGRAM COMPLETE
Area-P 1/2: 3.6 cm2
Calc EF: 46.6 %
S' Lateral: 4.33 cm
Single Plane A2C EF: 51.7 %
Single Plane A4C EF: 41.4 %

## 2021-05-11 ENCOUNTER — Telehealth: Payer: Self-pay | Admitting: *Deleted

## 2021-05-11 NOTE — Telephone Encounter (Signed)
Patient informed. Copy sent to PCP °

## 2021-05-11 NOTE — Telephone Encounter (Signed)
-----   Message from Laurine Blazer, LPN sent at 3/55/2174 11:06 AM EDT -----  ----- Message ----- From: Verta Ellen., NP Sent: 05/07/2021   7:48 PM EDT To: Laurine Blazer, LPN  Please call the patient and let him know the echocardiogram shows the pumping  remains 40%. Unchanged from previous study. No leaking valves.   Verta Ellen, NP  05/07/2021 7:23 PM

## 2021-05-12 ENCOUNTER — Ambulatory Visit: Payer: Medicare Other | Admitting: Family Medicine

## 2021-05-12 NOTE — Progress Notes (Signed)
Cardiology Office Note  Date: 05/14/2021   ID: Marvin Sanchez, DOB 1935/01/03, MRN [id number]  PCP:  Eber Hong, MD  Cardiologist:  Rozann Lesches, MD Electrophysiologist:  Thompson Grayer, MD   Chief Complaint: 56-monthfollow-up  History of Present Illness: Marvin TEJADAis a 86y.o. male with a history of chronic atrial fibrillation, chronic systolic heart failure, HTN, GERD, AICD in place, nonischemic cardiomyopathy, DM2, hyperlipidemia, hypothyroidism.  He was previously seen by Dr. MDomenic Politeon 10/07/2020.  He reported no sense of palpitations or chest pain he was following Dr. ARayann Hemanfor BSpartain place.  His most recent device check showed normal function.  He did not report any device shocks or syncope.  His cardiac medications were stable from a cardiac perspective.  He did not reported any bleeding on Xarelto.  His LVEF was 20% by echo in 2020.  Follow-up study was ordered for reevaluation.  He was clinically stable on Toprol-XL and Avapro.  His CHA2DS2-VASc score was 5.  He was continuing Xarelto 15 mg daily.  He had stable renal function on recent labs.  Creatinine was 1.76 given his stage IIIb CKD.   At last follow[facility]up [facility]  Also taking Lasix due to complaints of nighttime dyspnea.  He denied orthopnea or PND.  His weights were staying in the 188 range.  Denies any lower extremity edema.  Denies any palpitations or arrhythmias.  No orthostatic symptoms, denies any bleeding on Xarelto.  [place]  He had recent remote ICD check on 03/07/2021 which showed normal device function.  He is here today for 267-monthollow-up.  States he is feeling much better and attributes this to initiation of Entresto.  He had recent labs in July demonstrating a creatinine of 1.64 and GFR of 41.  This was an improvement over previous creatinine and GFR in April 1.78 and 37 respectively.   States he walks daily with his dog and feels like he has much more energy.  He denies any significant issues with his atrial fibrillation.  Atrial fibrillation is controlled today.  EKG demonstrates atrial fibrillation with premature ventricular or aberrantly conducted complexes, left axis deviation, nonspecific intraventricular conduction block.  Rate of 66.  He denies any anginal symptoms, DOE, SOB, PND, orthopnea, edema.  Weight is stable.  At recent recent PCP visit he weighed 190.  Today he weighs 191.  Blood pressure is well controlled at 116/78   Past Medical History:  Diagnosis Date   Allergic rhinitis    Cardiac defibrillator in place 2016   [place] ICD DF4 VR model # [id number] #[id number]  [place]   LVEF 25-30%   Chronic atrial fibrillation (HCC)    Chronic systolic heart failure (HCC)    Esophageal polyp    Essential hypertension    GERD (gastroesophageal reflux disease)    History of skin cancer    Squamous cell   Hyperlipidemia    Hypothyroidism    [place]ischemic cardiomyopathy [place]   Normal coronary arteries at cardiac catheterization 2015   Osteoarthritis    Renal calculi    Type 2 diabetes mellitus ([place]CF[place]   Vitamin D deficiency     Past Surgical History:  Procedure Laterality Date   BIOPSY  03/14/2020   Procedure: BIOPSY;  Surgeon: CaHarvel QualeMD;  [facility];  Service:  Gastroenterology;;  duodenal   CARDIAC CATHETERIZATION  09/2013   CARDIAC DEFIBRILLATOR PLACEMENT  01/07/2015   [employer] Dynagen VR ICD implanted by [employer] physicians in [place] WITH PROPOFOL N/A 03/14/2020   Procedure: COLONOSCOPY WITH PROPOFOL;  Surgeon: Harvel Quale, MD;  Location[facility];  Service: Gastroenterology;  Laterality: N/A;  815   ESOPHAGOGASTRODUODENOSCOPY     ESOPHAGOGASTRODUODENOSCOPY (EGD) WITH PROPOFOL N/A 03/14/2020   Procedure:  ESOPHAGOGASTRODUODENOSCOPY (EGD) WITH PROPOFOL;  Surgeon: Harvel Quale, MD;  [facility];  Service: Gastroenterology;  Laterality: N/A;   POLYPECTOMY  03/14/2020   Procedure: POLYPECTOMY;  Surgeon: Montez Morita, Quillian Quince, MD;  Location[facility];  Service: Gastroenterology;;  gastric, duodenal,ascending   SKIN CANCER REMOVAL      TONSILLECTOMY      Current Outpatient Medications  Medication Sig Dispense Refill   acetaminophen (TYLENOL) 500 MG tablet Take 500 mg by mouth every 6 (six) hours as needed for mild pain or headache.     amLODipine (NORVASC) 2.5 MG tablet Take 2.5 mg by mouth in the morning and at bedtime.      cetirizine (ZYRTEC) 10 MG tablet Take 10 mg by mouth daily.      Cholecalciferol (VITAMIN D-3) 25 MCG (1000 UT) CAPS Take 1,000 Units by mouth daily.     cromolyn (NASALCROM) 5.2 MG/ACT nasal spray Place 1 spray into both nostrils daily as needed for allergies or rhinitis.     Cyanocobalamin 1000 MCG/ML KIT Inject 1,000 mcg as directed every 30 (thirty) days.     furosemide (LASIX) 20 MG tablet Take 20 mg by mouth daily.     gabapentin (NEURONTIN) 300 MG capsule Take 300 mg by mouth 2 (two) times daily.     levothyroxine (SYNTHROID, LEVOTHROID) 25 MCG tablet Take 25 mcg by mouth daily before breakfast.     metoprolol succinate (TOPROL-XL) 100 MG 24 hr tablet TAKE 1 TABLET TWICE DAILY (Patient taking differently: Take 100 mg by mouth 2 (two) times daily.) 180 tablet 3   omeprazole (PRILOSEC) 20 MG capsule Take 20 mg by mouth every Monday, Wednesday, and Friday.     Rivaroxaban (XARELTO) 15 MG TABS tablet TAKE 1 TABLET DAILY WITH   SUPPER 90 tablet 3   sacubitril-valsartan (ENTRESTO) 24-26 MG Take 1 tablet by mouth 2 (two) times daily. 180 tablet 3   VITRON-C 65-125 MG TABS Take 1 tablet by mouth 2 (two) times daily.     No current facility-administered medications for this visit.   Allergies:  Hyzaar [losartan potassium-hctz]   Social History:  The patient  reports that he quit smoking about 52 years ago. His smoking use included cigarettes. He has never used smokeless tobacco. He reports that he does not drink alcohol and does not use drugs.   Family History: The patient's family history includes Diabetes Mellitus II in his brother and sister; Heart failure in his father; Prostate cancer in his father; Stroke in his mother.   ROS:  Please see the history of present illness. Otherwise, complete review of systems is positive for none.  All other systems are reviewed and negative.   Physical Exam: VS:  BP 116/78   Pulse 64   Ht _0  (1.778 m)   Wt 191 lb 3.2 oz (86.7 kg)   SpO2 98%   BMI 27.43 kg/m , BMI Body mass index is 27.43 kg/m.  Wt Readings from Last  3 Encounters:  05/14/21 191 lb 3.2 oz (86.7 kg)  03/10/21 188 lb 12.8 oz (85.6 kg)  10/07/20 192 lb (87.1 kg)    General: Patient appears comfortable at rest. Neck: Supple, no elevated JVP or carotid bruits, no thyromegaly. Lungs: Clear to auscultation, nonlabored breathing at rest. Cardiac: Irregularly irregular rate and rhythm, no S3 or significant systolic murmur, no pericardial rub. Extremities: No pitting edema, distal pulses 2+. Skin: Warm and dry. Musculoskeletal: No kyphosis. Neuropsychiatric: Alert and oriented x3, affect grossly appropriate.  ECG: May 14, 2021 atrial fibrillation with premature ventricular or aberrantly conducted complexes rate of 66, left axis deviation, nonspecific intraventricular block, nonspecific T wave abnormality.  Recent Labwork: 06/19/2020: ALT 17; AST 24 08/30/2020: BUN 24; Creat 1.76; Hemoglobin 16.9; Platelets 243; Potassium 4.4; Sodium 140  No results found for: CHOL, TRIG, HDL, CHOLHDL, VLDL, LDLCALC, LDLDIRECT  Other Studies Reviewed Today:   Echocardiogram 05/07/2021  1. Left ventricular ejection fraction, by estimation, is 40%. The left  ventricle has mildly decreased function. The left ventricle demonstrates   global hypokinesis. Left ventricular diastolic parameters are  indeterminate.   2. RV poorly visualized. Grossly RV appears dilated with decreased  systolic function. . Right ventricular systolic function was not well  visualized. The right ventricular size is not well visualized. There is  normal pulmonary artery systolic pressure.   3. Left atrial size was mildly dilated.   4. Right atrial size was mildly dilated.   5. The mitral valve is normal in structure. No evidence of mitral valve  regurgitation. No evidence of mitral stenosis.   6. The aortic valve is tricuspid. Aortic valve regurgitation is not  visualized. No aortic stenosis is present.   7. The inferior vena cava is normal in size with greater than 50%  respiratory variability, suggesting right atrial pressure of 3 mmHg.   Comparison(s): Previous Echo showed LV EF 40%, diffuse LV hypokinesis,  moderately reduced RV function, severe LAE, mild RAE.    Echocardiogram 11/06/2020  1. Left ventricular ejection fraction, by estimation, is 30 to 35%. The left ventricle has moderately decreased function. The left ventricle demonstrates global hypokinesis. The left ventricular internal cavity size was moderately dilated. Left ventricular diastolic parameters are indeterminate. The average left ventricular global longitudinal strain is -6.4 %. The global longitudinal strain is abnormal. 2. Right ventricular systolic function is normal. The right ventricular size is normal. 3. Left atrial size was severely dilated. 4. Right atrial size was severely dilated. 5. The mitral valve is normal in structure. Mild mitral valve regurgitation. No evidence of mitral stenosis. 6. The aortic valve is tricuspid. Aortic valve regurgitation is trivial. No aortic stenosis is present. 7. The inferior vena cava is normal in size with greater than 50% respiratory variability, suggesting right atrial pressure of 3 mmHg. Comparison(s): Echocardiogram done  09/20/18 showed an EF of 40%.    Echocardiogram 09/20/2018:  1. The left ventricle has a visually estimated ejection fraction of  40%. The cavity size was normal. There is mildly increased left  ventricular wall thickness. Left ventricular diastology could not be  evaluated secondary to atrial fibrillation.  Elevated left ventricular end-diastolic pressure Left ventricular diffuse  hypokinesis.   2. The right ventricle has moderately reduced systolic function. The  cavity was normal. There is no increase in right ventricular wall  thickness.   3. Left atrial size was severely dilated.   4. Right atrial size was mildly dilated.   5. The mitral valve is normal in structure.  6. The tricuspid valve is normal in structure.   7. The aortic valve is tricuspid.   8. The aortic root is normal in size and structure.   9. Right atrial pressure is estimated at 10 mmHg.    Assessment and Plan:  1. Shortness of breath   2. Nonischemic cardiomyopathy ([place])   3. ICD (implantable cardioverter-defibrillator) in place   4. Permanent atrial fibrillation (HCC)   5. Stage 3b chronic kidney disease ([place])   6. Essential hypertension     1. Shortness of breath He states he is feeling much better since starting Entresto.  States he usually walks his dog daily and feels as though he has more energy since starting [place].  2. Nonischemic cardiomyopathy ([place]) Recent follow-up echocardiogram Showed some improvement to EF up to 40% from previous study in March when EF was 30 to 35%.  LV has global hypokinesis, indeterminate diastolic parameters, LA mildly dilated, RA mildly dilated.  Continue Entresto 24/26 mg p.o. twice daily.  Given renal function would likely not be beneficial to advance care with increasing Entresto dosage at this point.  Continue Lasix 20 mg daily.  3. ICD (implantable cardioverter-defibrillator) in place Had a recent ICD check on 03/07/2021 with normal device function.  He follows with  Dr. Rayann Heman for device management.  4. Permanent atrial fibrillation ([place]) He denies any palpitations or arrhythmias.  EKG today atrial fibrillation with premature ventricular aberrantly conducted complexes left axis deviation, nonspecific intraventricular block rate of 66, nonspecific T wave abnormality.  Continue Toprol 100 mg p.o. twice daily.  Continue Xarelto 15 mg p.o. daily.  5. Stage 3b chronic kidney disease ([place]) Creatinine 1.64 and GFR 41 on lab work 02/19/2021.  Renal function has slightly improved since previous labs in April.  He continues to follow with Dr. Christa See in [place] for renal disease.  6.  Essential hypertension. Blood pressure well-controlled at 116/78 Continue amlodipine 2.5 mg daily.  Continue Toprol-XL 100 mg daily.  Continue Lasix 20 mg daily.  Medication Adjustments/Labs and Tests Ordered: Current medicines are reviewed at length with the patient today.  Concerns regarding medicines are outlined above.   Disposition: Follow-up with Dr. Domenic Polite or APP 6 months  Signed, Levell July, NP 05/14/2021 11:12 AM    [facility] [address], [place], [place] [address] Phone: [phone]; Fax: [phone]

## 2021-05-14 ENCOUNTER — Encounter: Payer: Self-pay | Admitting: Family Medicine

## 2021-05-14 ENCOUNTER — Ambulatory Visit (INDEPENDENT_AMBULATORY_CARE_PROVIDER_SITE_OTHER): Payer: Medicare Other | Admitting: Family Medicine

## 2021-05-14 ENCOUNTER — Other Ambulatory Visit: Payer: Self-pay

## 2021-05-14 VITALS — BP 116/78 | HR 64 | Ht 70.0 in | Wt 191.2 lb

## 2021-05-14 DIAGNOSIS — I10 Essential (primary) hypertension: Secondary | ICD-10-CM

## 2021-05-14 DIAGNOSIS — I4821 Permanent atrial fibrillation: Secondary | ICD-10-CM

## 2021-05-14 DIAGNOSIS — I255 Ischemic cardiomyopathy: Secondary | ICD-10-CM

## 2021-05-14 DIAGNOSIS — Z95810 Presence of automatic (implantable) cardiac defibrillator: Secondary | ICD-10-CM | POA: Diagnosis not present

## 2021-05-14 DIAGNOSIS — I428 Other cardiomyopathies: Secondary | ICD-10-CM | POA: Diagnosis not present

## 2021-05-14 DIAGNOSIS — N1832 Chronic kidney disease, stage 3b: Secondary | ICD-10-CM

## 2021-05-14 DIAGNOSIS — R0602 Shortness of breath: Secondary | ICD-10-CM

## 2021-05-14 NOTE — Patient Instructions (Signed)

## 2021-05-15 NOTE — Addendum Note (Signed)
Addended by: Merlene Laughter on: 05/15/2021 08:47 AM   Modules accepted: Orders

## 2021-06-05 ENCOUNTER — Ambulatory Visit (INDEPENDENT_AMBULATORY_CARE_PROVIDER_SITE_OTHER): Payer: Medicare Other

## 2021-06-05 DIAGNOSIS — I428 Other cardiomyopathies: Secondary | ICD-10-CM | POA: Diagnosis not present

## 2021-06-05 LAB — CUP PACEART REMOTE DEVICE CHECK
Battery Remaining Longevity: 132 mo
Battery Remaining Percentage: 100 %
Brady Statistic RV Percent Paced: 2 %
Date Time Interrogation Session: 20221028005000
HighPow Impedance: 47 Ohm
Implantable Lead Implant Date: 20160531
Implantable Lead Location: 753860
Implantable Lead Model: 296
Implantable Lead Serial Number: 129098
Implantable Pulse Generator Implant Date: 20160531
Lead Channel Impedance Value: 391 Ohm
Lead Channel Pacing Threshold Amplitude: 0.8 V
Lead Channel Pacing Threshold Pulse Width: 0.4 ms
Lead Channel Setting Pacing Amplitude: 2.5 V
Lead Channel Setting Pacing Pulse Width: 0.4 ms
Lead Channel Setting Sensing Sensitivity: 0.6 mV
Pulse Gen Serial Number: 206098

## 2021-06-12 ENCOUNTER — Ambulatory Visit (INDEPENDENT_AMBULATORY_CARE_PROVIDER_SITE_OTHER): Payer: Medicare Other | Admitting: Internal Medicine

## 2021-06-12 VITALS — BP 106/64 | HR 61 | Ht 70.0 in | Wt 189.6 lb

## 2021-06-12 DIAGNOSIS — D6869 Other thrombophilia: Secondary | ICD-10-CM

## 2021-06-12 DIAGNOSIS — I519 Heart disease, unspecified: Secondary | ICD-10-CM | POA: Diagnosis not present

## 2021-06-12 DIAGNOSIS — I255 Ischemic cardiomyopathy: Secondary | ICD-10-CM | POA: Diagnosis not present

## 2021-06-12 DIAGNOSIS — I10 Essential (primary) hypertension: Secondary | ICD-10-CM

## 2021-06-12 DIAGNOSIS — I4821 Permanent atrial fibrillation: Secondary | ICD-10-CM | POA: Diagnosis not present

## 2021-06-12 DIAGNOSIS — Z95810 Presence of automatic (implantable) cardiac defibrillator: Secondary | ICD-10-CM

## 2021-06-12 DIAGNOSIS — I428 Other cardiomyopathies: Secondary | ICD-10-CM

## 2021-06-12 LAB — CUP PACEART INCLINIC DEVICE CHECK
Brady Statistic RV Percent Paced: 2 %
Date Time Interrogation Session: 20221104105714
HighPow Impedance: 55 Ohm
Implantable Lead Implant Date: 20160531
Implantable Lead Location: 753860
Implantable Lead Model: 296
Implantable Lead Serial Number: 129098
Implantable Pulse Generator Implant Date: 20160531
Lead Channel Impedance Value: 407 Ohm
Lead Channel Pacing Threshold Amplitude: 0.8 V
Lead Channel Pacing Threshold Pulse Width: 0.4 ms
Lead Channel Sensing Intrinsic Amplitude: 15.2 mV
Lead Channel Setting Pacing Amplitude: 2.5 V
Lead Channel Setting Pacing Pulse Width: 0.4 ms
Lead Channel Setting Sensing Sensitivity: 0.6 mV
Pulse Gen Serial Number: 206098

## 2021-06-12 NOTE — Patient Instructions (Signed)
Medication Instructions:  Continue all current medications.  Labwork: none  Testing/Procedures: none  Follow-Up: 1 year - Dr.  Allred   Any Other Special Instructions Will Be Listed Below (If Applicable).   If you need a refill on your cardiac medications before your next appointment, please call your pharmacy.  

## 2021-06-12 NOTE — Progress Notes (Signed)
PCP: Eber Hong, MD Primary Cardiologist: Dr Domenic Polite Primary EP: Dr Memory Argue is a 86 y.o. male who presents today for routine electrophysiology followup.  Since last being seen in our clinic, the patient reports doing very well.  Today, he denies symptoms of palpitations, chest pain, shortness of breath,  lower extremity edema, dizziness, presyncope, syncope, or ICD shocks.  The patient is otherwise without complaint today.   Past Medical History:  Diagnosis Date   Allergic rhinitis    Cardiac defibrillator in place 2016   [place] EL ICD DF4 VR model # [id number], serial [id number]    Cardiomyopathy ([place])    LVEF 25-30%   Chronic atrial fibrillation (HCC)    Chronic systolic heart failure (HCC)    Esophageal polyp    Essential hypertension    GERD (gastroesophageal reflux disease)    History of skin cancer    Squamous cell   Hyperlipidemia    Hypothyroidism    Nonischemic cardiomyopathy (HCC)    Normal coronary arteries at cardiac catheterization 2015   Osteoarthritis    Renal calculi    Type 2 diabetes mellitus ([place])    Vitamin D deficiency    Past Surgical History:  Procedure Laterality Date   BIOPSY  03/14/2020   Procedure: BIOPSY;  Surgeon: Harvel Quale, MD;  [facility];  Service: Gastroenterology;;  duodenal   CARDIAC CATHETERIZATION  09/2013   CARDIAC DEFIBRILLATOR PLACEMENT  01/07/2015   [employer] Dynagen VR ICD implanted by [employer] physicians in [place] PROPOFOL N/A 03/14/2020   Procedure: COLONOSCOPY WITH PROPOFOL;  Surgeon: Harvel Quale, MD;  [facility];  Service: Gastroenterology;  Laterality: N/A;  815   ESOPHAGOGASTRODUODENOSCOPY     ESOPHAGOGASTRODUODENOSCOPY (EGD) WITH PROPOFOL N/A 03/14/2020   Procedure: ESOPHAGOGASTRODUODENOSCOPY (EGD) WITH PROPOFOL;  Surgeon: Harvel Quale, MD;  [facility];   Service: Gastroenterology;  Laterality: N/A;   POLYPECTOMY  03/14/2020   Procedure: POLYPECTOMY;  Surgeon: Montez Morita, Quillian Quince, MD;  [facility];  Service: Gastroenterology;;  gastric, duodenal,ascending   SKIN CANCER REMOVAL      TONSILLECTOMY      ROS- all systems are reviewed and negative except as per HPI above  Current Outpatient Medications  Medication Sig Dispense Refill   acetaminophen (TYLENOL) 500 MG tablet Take 500 mg by mouth every 6 (six) hours as needed for mild pain or headache.     amLODipine (NORVASC) 2.5 MG tablet Take 2.5 mg by mouth in the morning and at bedtime.      cetirizine (ZYRTEC) 10 MG tablet Take 10 mg by mouth daily.      Cholecalciferol (VITAMIN D-3) 25 MCG (1000 UT) CAPS Take 1,000 Units by mouth daily.     cromolyn (NASALCROM) 5.2 MG/ACT nasal spray Place 1 spray into both nostrils daily as needed for allergies or rhinitis.     Cyanocobalamin 1000 MCG/ML KIT Inject 1,000 mcg as directed every 30 (thirty) days.     furosemide (LASIX) 20 MG tablet Take 20 mg by mouth daily.     gabapentin (NEURONTIN) 300 MG capsule Take 300 mg by mouth 2 (two) times daily.     levothyroxine (SYNTHROID, LEVOTHROID) 25 MCG tablet Take 25 mcg by mouth daily before breakfast.     metoprolol succinate (TOPROL-XL) 100 MG 24 hr tablet TAKE 1 TABLET TWICE DAILY (Patient taking differently: Take  100 mg by mouth 2 (two) times daily.) 180 tablet 3   omeprazole (PRILOSEC) 20 MG capsule Take 20 mg by mouth every Monday, Wednesday, and Friday.     Rivaroxaban (XARELTO) 15 MG TABS tablet TAKE 1 TABLET DAILY WITH   SUPPER 90 tablet 3   sacubitril-valsartan (ENTRESTO) 24-26 MG Take 1 tablet by mouth 2 (two) times daily. 180 tablet 3   VITRON-C 65-125 MG TABS Take 1 tablet by mouth 2 (two) times daily.     No current facility-administered medications for this visit.    Physical Exam: Vitals:   06/12/21 1046  BP: 106/64  Pulse: 61  SpO2: 98%  Weight: 189 lb 9.6 oz (86  kg)  Height: _0  (1.778 m)    GEN- The patient is well appearing, alert and oriented x 3 today.   Head- normocephalic, atraumatic Eyes-  Sclera clear, conjunctiva pink Ears- hearing intact Oropharynx- clear Lungs-  normal work of breathing Chest- ICD pocket is well healed Heart- irregular rate and rhythm,  GI- soft, NT, ND, + BS Extremities- no clubbing, cyanosis, or edema  ICD interrogation- reviewed in detail today,  See PACEART report  Echo 05/07/21- EF 40%  ekg tracing ordered today is personally reviewed and shows afib, V rates 60s,  QRS 130 msec  Wt Readings from Last 3 Encounters:  06/12/21 189 lb 9.6 oz (86 kg)  05/14/21 191 lb 3.2 oz (86.7 kg)  03/10/21 188 lb 12.8 oz (85.6 kg)    Assessment and Plan:  1.  Chronic systolic dysfunction/ nonischemic CM euvolemic today Stable on an appropriate medical regimen Normal ICD function See [facility] report No changes today he is not device dependant today  2. Permanent atrial fibrillation Rate controlled Could probably reduce metoprolol according to histograms.  Will consider in the future. On xarelto for stroke prevention (chads2vasc score of 5) Labs 05/06/21 reviewed  3. HTN Stable No change required today   Risks, benefits and potential toxicities for medications prescribed and/or refilled reviewed with patient today.   Return in a year Follow-up with Dr Domenic Polite as scheduled  Thompson Grayer MD, [facility] 06/12/2021 10:51 AM

## 2021-06-15 NOTE — Progress Notes (Signed)
Remote ICD transmission.   

## 2021-06-18 ENCOUNTER — Other Ambulatory Visit: Payer: Self-pay | Admitting: *Deleted

## 2021-06-18 MED ORDER — ENTRESTO 24-26 MG PO TABS: 1.0000 | ORAL_TABLET | Freq: Two times a day (BID) | ORAL | 3 refills | Status: DC

## 2021-07-09 ENCOUNTER — Other Ambulatory Visit: Payer: Self-pay | Admitting: *Deleted

## 2021-07-09 MED ORDER — RIVAROXABAN 15 MG PO TABS: ORAL_TABLET | ORAL | 3 refills | Status: DC

## 2021-07-31 IMAGING — CT CT ABD-PELV W/ CM
2 of 4 series · 13 of 36 positions shown, 18 images · IV contrast (Omnipaque or Isovue)
Comparison: None.

CLINICAL DATA: Nausea and vomiting.

EXAM:
CT ABDOMEN AND PELVIS WITH CONTRAST
TECHNIQUE: Multidetector CT imaging of the abdomen and pelvis was performed
using the standard protocol following bolus administration of
intravenous contrast.
CONTRAST:  75mL OMNIPAQUE IOHEXOL 300 MG/ML  SOLN

[Series 2: axial st · axial · 0.86mm/px · z∈[+536,+926]mm · 12 of 92 slices shown, 16 images]
[im 9/92  soft-tissue]
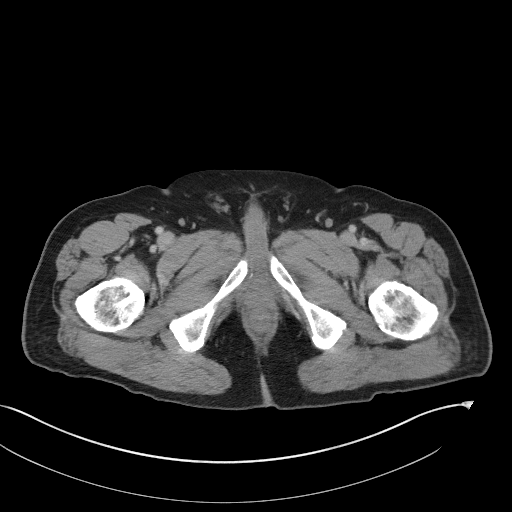
[im 9/92  bone]
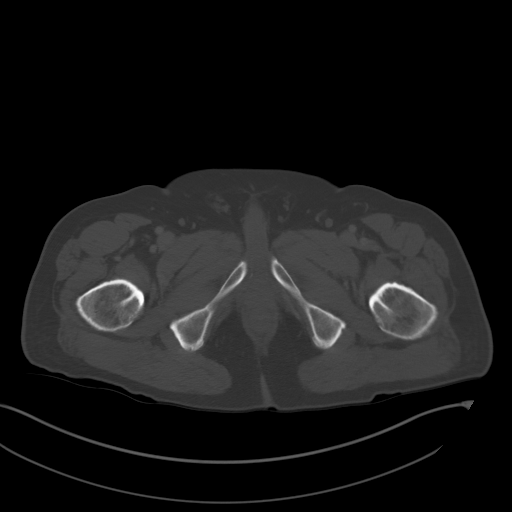
[im 18/92  soft-tissue]
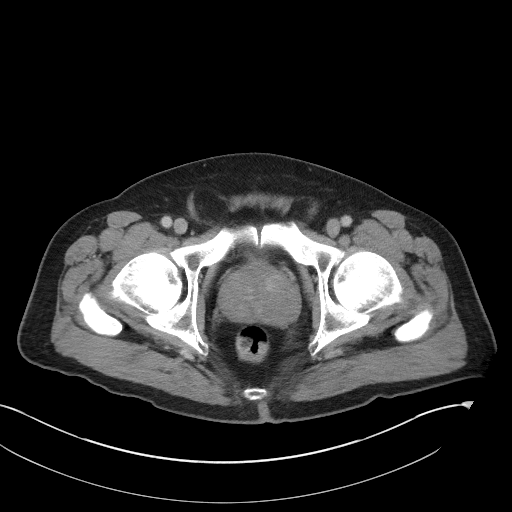
[im 27/92  soft-tissue]
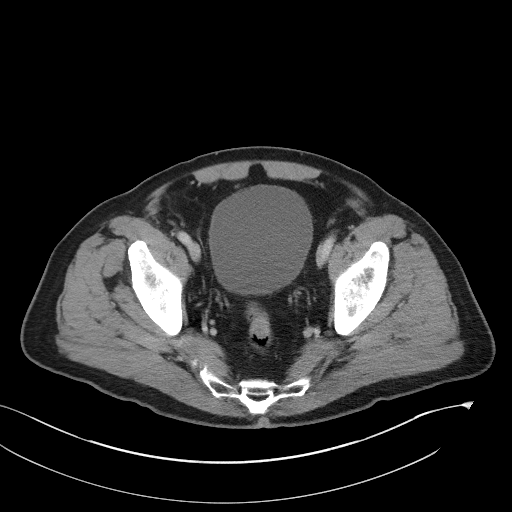
[im 35/92  soft-tissue]
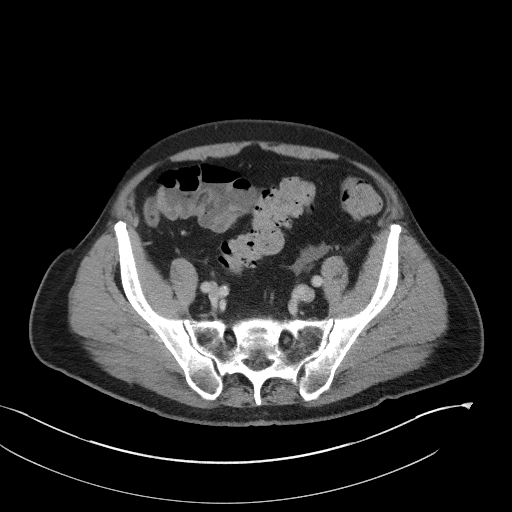
[im 44/92  soft-tissue]
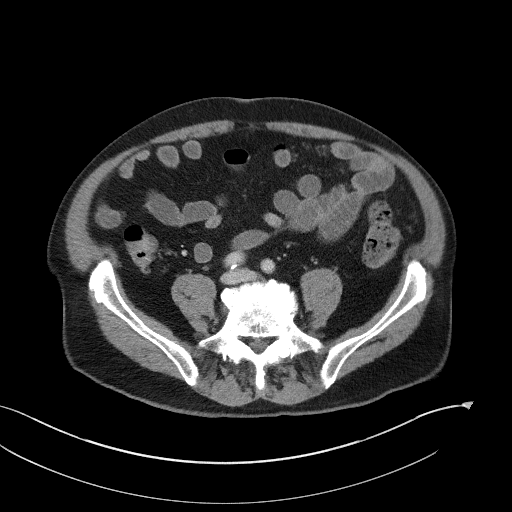
[im 53/92  soft-tissue]
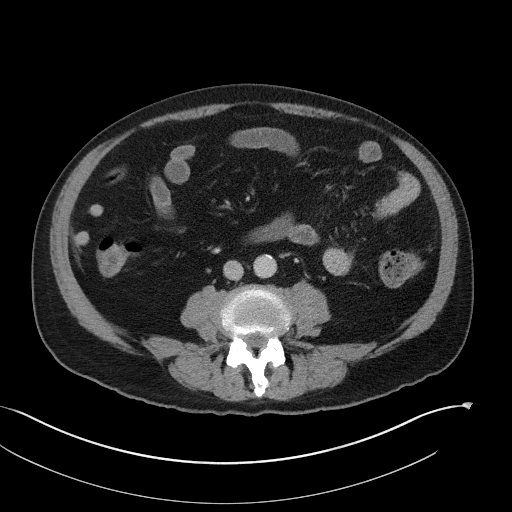
[im 61/92  soft-tissue]
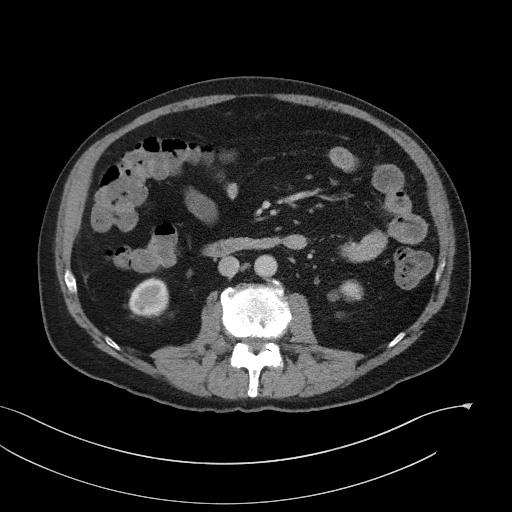
[im 70/92  soft-tissue]
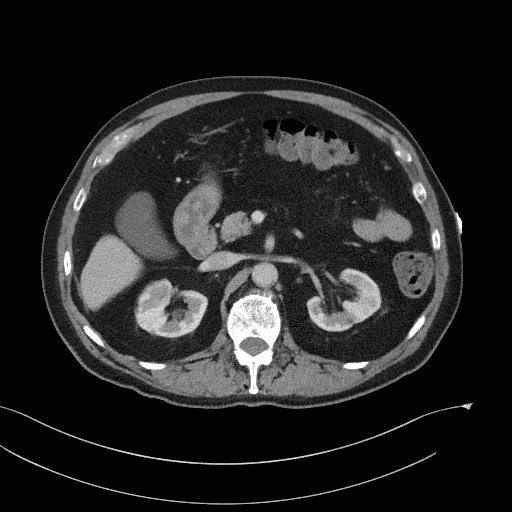
[im 74/92  lung]
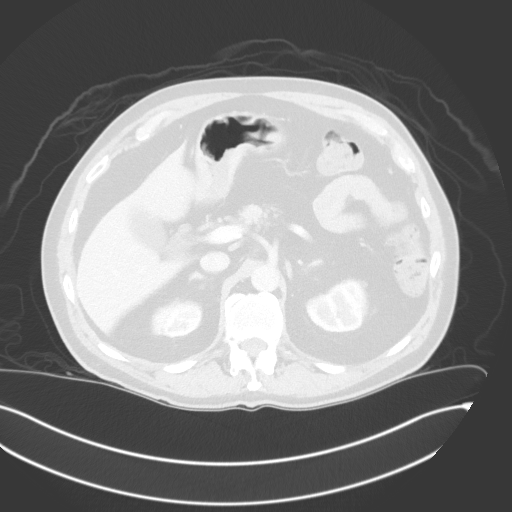
[im 79/92  soft-tissue]
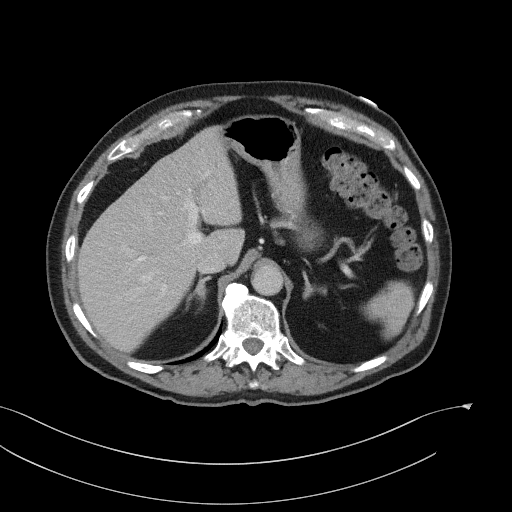
[im 79/92  lung]
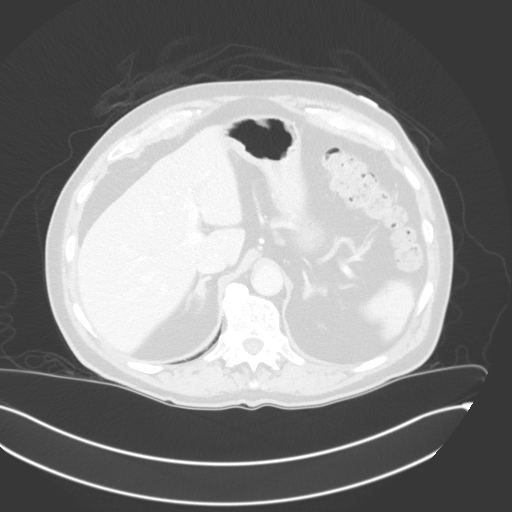
[im 79/92  bone]
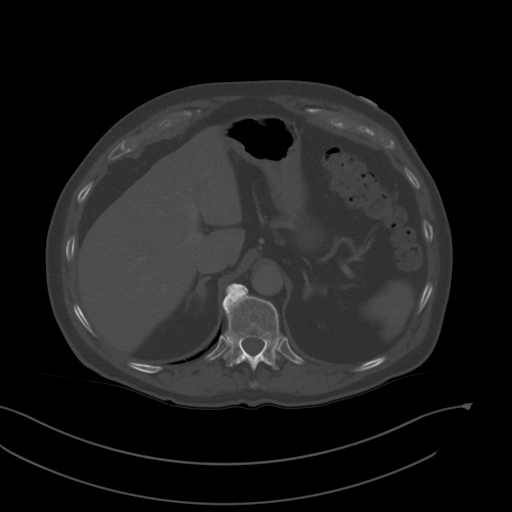
[im 83/92  lung]
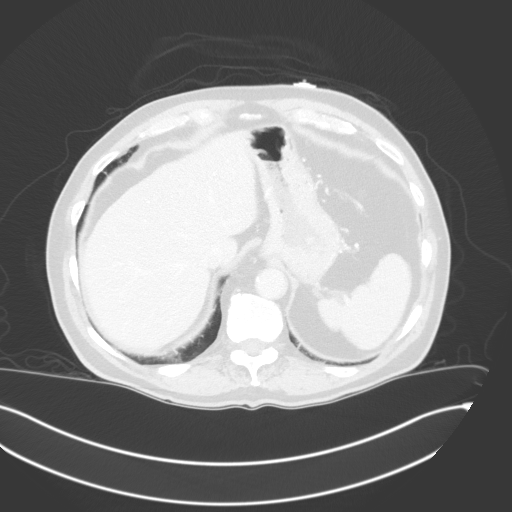
[im 87/92  soft-tissue]
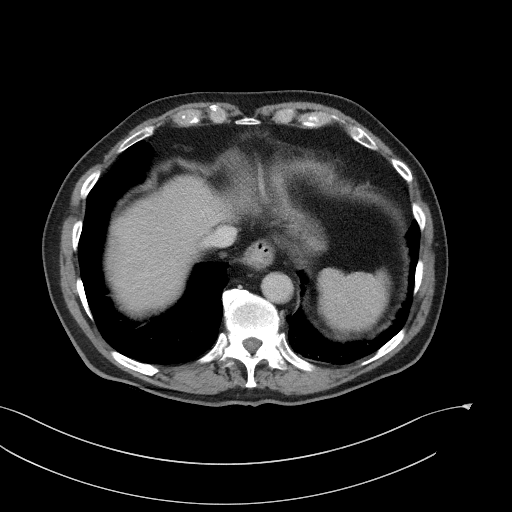
[im 87/92  lung]
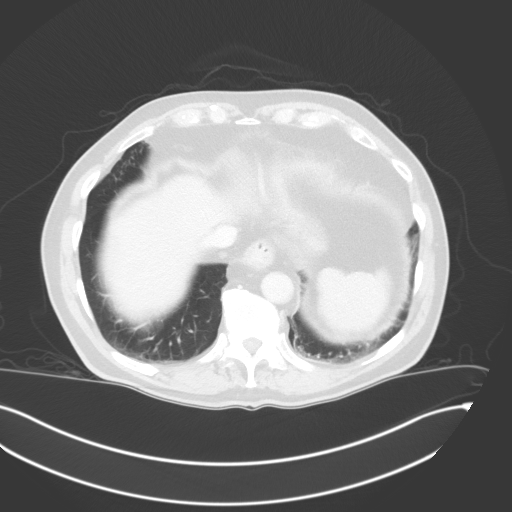

[Series 6: sagittal st · sagittal · 0.69mm/px · 1 of 130 slices shown, 2 images]
[im 44/130  soft-tissue]
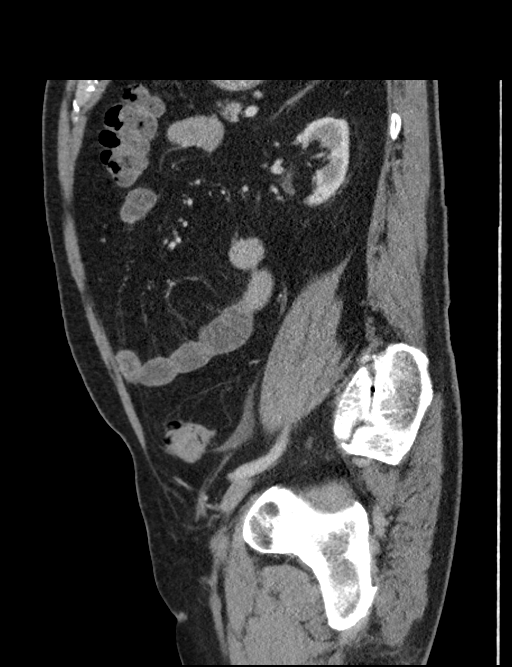
[im 44/130  bone]
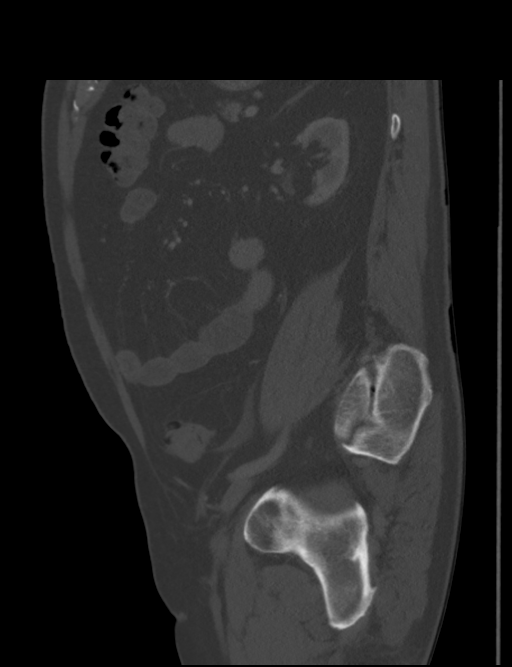

[13 of 36 positions shown; findings below may reference images not displayed]

FINDINGS: Lower chest: The lung bases are clear. The heart is enlarged.

Hepatobiliary: The liver is normal. Normal gallbladder.There is no
biliary ductal dilation.

Pancreas: Normal contours without ductal dilatation. No
peripancreatic fluid collection.

Spleen: Unremarkable.

Adrenals/Urinary Tract:

--Adrenal glands: Unremarkable.

--Right kidney/ureter: No hydronephrosis or radiopaque kidney
stones.

--Left kidney/ureter: No hydronephrosis or radiopaque kidney stones.

--Urinary bladder: Unremarkable.

Stomach/Bowel:

--Stomach/Duodenum: No hiatal hernia or other gastric abnormality.
Normal duodenal course and caliber.

--Small bowel: There are few mildly prominent fluid-filled loops of
small bowel scattered throughout the abdomen without evidence for
high-grade obstruction.

--Colon: There are scattered colonic diverticula without CT evidence
for diverticulitis.

--Appendix: Normal.

Vascular/Lymphatic: Atherosclerotic calcification is present within
the non-aneurysmal abdominal aorta, without hemodynamically
significant stenosis.

--No retroperitoneal lymphadenopathy.

--No mesenteric lymphadenopathy.

--No pelvic or inguinal lymphadenopathy.

Reproductive: The prostate gland is enlarged.

Other: There is a small amount of free fluid in the patient's
abdomen and pelvis, most notably in the left lower quadrant. There
are bilateral fat containing inguinal hernias, right greater than
left.

Musculoskeletal. No acute displaced fractures.
IMPRESSION: 1. There are few mildly prominent fluid-filled loops of small bowel
scattered throughout the abdomen without evidence for high-grade
obstruction. Findings may be secondary to an enteritis.
2. Small amount of free fluid in the patient's abdomen and pelvis,
most notably in the left lower quadrant.
3. Bilateral fat containing inguinal hernias, right greater than
left.
4. Cardiomegaly.
5. Prostatomegaly

Aortic Atherosclerosis ([id number]-[id number].E).

## 2021-09-04 ENCOUNTER — Ambulatory Visit (INDEPENDENT_AMBULATORY_CARE_PROVIDER_SITE_OTHER): Payer: Medicare Other

## 2021-09-04 DIAGNOSIS — I428 Other cardiomyopathies: Secondary | ICD-10-CM

## 2021-09-04 LAB — CUP PACEART REMOTE DEVICE CHECK
Battery Remaining Longevity: 120 mo
Battery Remaining Percentage: 100 %
Brady Statistic RV Percent Paced: 2 %
Date Time Interrogation Session: 20230127005000
HighPow Impedance: 49 Ohm
Implantable Lead Implant Date: 20160531
Implantable Lead Location: 753860
Implantable Lead Model: 296
Implantable Lead Serial Number: 129098
Implantable Pulse Generator Implant Date: 20160531
Lead Channel Impedance Value: 409 Ohm
Lead Channel Pacing Threshold Amplitude: 0.8 V
Lead Channel Pacing Threshold Pulse Width: 0.4 ms
Lead Channel Setting Pacing Amplitude: 2.5 V
Lead Channel Setting Pacing Pulse Width: 0.4 ms
Lead Channel Setting Sensing Sensitivity: 0.6 mV
Pulse Gen Serial Number: 206098

## 2021-09-14 NOTE — Progress Notes (Signed)
Remote ICD transmission.   

## 2021-11-09 NOTE — Progress Notes (Signed)
? ? ?Cardiology Office Note ? ?Date: 11/10/2021  ? ?ID: Marvin Sanchez, DOB 09/29/1934, MRN [id number] ? ?PCP:  Eber Hong, MD  ?Cardiologist:  Rozann Lesches, MD ?Electrophysiologist:  Thompson Grayer, MD  ? ?Chief Complaint  ?Patient presents with  ? Cardiac follow-up  ? ? ?History of Present Illness: ?Marvin Sanchez is an 87 y.o. male last seen in October 2022 by [name] NP.  He is here for a routine visit.  Reports NYHA class II dyspnea, no palpitations or syncope.  He walks his dog about 1/2-3/4 of a mile each day.  Weight has been stable as well. ? ?[employer] ICD in place with follow-up by Dr. Rayann Heman.  Device check in January revealed normal function.  He has had no device shocks. ? ?He is following with nephrology, CKD stage IIIb.  Most recent creatinine was 1.6 which was an improvement compared to last year. ? ?I went over his medications, he is also on Farxiga which was added in the interim. ? ?Past Medical History:  ?Diagnosis Date  ? Allergic rhinitis   ? Cardiac defibrillator in place 2016  ? [place] ICD DF4 VR model # [id number], serial [id number]   ? Cardiomyopathy ([place])   ? LVEF 25-30%  ? Chronic atrial fibrillation (HCC)   ? Chronic systolic heart failure ([place])   ? Esophageal polyp   ? Essential hypertension   ? GERD (gastroesophageal reflux disease)   ? History of skin cancer   ? Squamous cell  ? Hyperlipidemia   ? Hypothyroidism   ? Nonischemic cardiomyopathy ([place])   ? Normal coronary arteries at cardiac catheterization 2015  ? Osteoarthritis   ? Renal calculi   ? Type 2 diabetes mellitus ([place])   ? Vitamin D deficiency   ? ? ?Past Surgical History:  ?Procedure Laterality Date  ? BIOPSY  03/14/2020  ? Procedure: BIOPSY;  Surgeon: Harvel Quale, MD;  [facility];  Service: Gastroenterology;;  duodenal  ? CARDIAC CATHETERIZATION  09/2013  ? CARDIAC DEFIBRILLATOR PLACEMENT  01/07/2015  ? [place] VR ICD implanted by [employer] in [place]  ?  CATARACT EXTRACTION    ? COLONOSCOPY    ? COLONOSCOPY WITH PROPOFOL N/A 03/14/2020  ? Procedure: COLONOSCOPY WITH PROPOFOL;  Surgeon: Harvel Quale, MD;  [facility];  Service: Gastroenterology;  Laterality: N/A;  815  ? ESOPHAGOGASTRODUODENOSCOPY    ? ESOPHAGOGASTRODUODENOSCOPY (EGD) WITH PROPOFOL N/A 03/14/2020  ? Procedure: ESOPHAGOGASTRODUODENOSCOPY (EGD) WITH PROPOFOL;  Surgeon: Harvel Quale, MD;  [facility];  Service: Gastroenterology;  Laterality: N/A;  ? POLYPECTOMY  03/14/2020  ? Procedure: POLYPECTOMY;  Surgeon: Harvel Quale, MD;  [facility];  Service: Gastroenterology;;  gastric, duodenal,ascending  ? SKIN CANCER REMOVAL     ? TONSILLECTOMY    ? ? ?Current Outpatient Medications  ?Medication Sig Dispense Refill  ? acetaminophen (TYLENOL) 500 MG tablet Take 500 mg by mouth every 6 (six) hours as needed for mild pain or headache.    ? amLODipine (NORVASC) 2.5 MG tablet Take 2.5 mg by mouth in the morning and at bedtime.     ? cetirizine (ZYRTEC) 10 MG tablet Take 10 mg by mouth daily.     ? Cholecalciferol (VITAMIN D-3) 25 MCG (1000 UT) CAPS Take 1,000 Units by mouth daily.    ? cromolyn (NASALCROM) 5.2 MG/ACT nasal spray Place 1 spray into both nostrils daily as needed for allergies or rhinitis.    ?  Cyanocobalamin 1000 MCG/ML KIT Inject 1,000 mcg as directed every 30 (thirty) days.    ? FARXIGA 10 MG TABS tablet Take 10 mg by mouth every morning.    ? furosemide (LASIX) 20 MG tablet Take 20 mg by mouth daily.    ? gabapentin (NEURONTIN) 300 MG capsule Take 300 mg by mouth 2 (two) times daily.    ? levothyroxine (SYNTHROID, LEVOTHROID) 25 MCG tablet Take 25 mcg by mouth daily before breakfast.    ? metoprolol succinate (TOPROL-XL) 100 MG 24 hr tablet TAKE 1 TABLET TWICE DAILY 180 tablet 3  ? omeprazole (PRILOSEC) 20 MG capsule Take 20 mg by mouth every Monday, Wednesday, and Friday.    ? Rivaroxaban (XARELTO) 15 MG TABS tablet TAKE 1  TABLET DAILY WITH   SUPPER 90 tablet 3  ? sacubitril-valsartan (ENTRESTO) 24-26 MG Take 1 tablet by mouth 2 (two) times daily. 180 tablet 3  ? sodium bicarbonate 650 MG tablet Take 650 mg by mouth 2 (two) times daily.    ? VITRON-C 65-125 MG TABS Take 1 tablet by mouth 2 (two) times daily.    ? ?No current facility-administered medications for this visit.  ? ?Allergies:  Losartan potassium-hctz  ? ?ROS: No orthopnea or PND.  No leg swelling. ? ?Physical Exam: ?VS:  BP (!) 102/58   Pulse 72   Ht 5' 10" (1.778 m)   Wt 188 lb 3.2 oz (85.4 kg)   SpO2 96%   BMI 27.00 kg/m? , BMI Body mass index is 27 kg/m?. ? ?Wt Readings from Last 3 Encounters:  ?11/10/21 188 lb 3.2 oz (85.4 kg)  ?06/12/21 189 lb 9.6 oz (86 kg)  ?05/14/21 191 lb 3.2 oz (86.7 kg)  ?  ?General: Patient appears comfortable at rest. ?HEENT: Conjunctiva and lids normal. ?Neck: Supple, no elevated JVP or carotid bruits, no thyromegaly. ?Lungs: Clear to auscultation, nonlabored breathing at rest. ?Cardiac: Regular rate and rhythm, no S3 or significant systolic murmur, no pericardial rub. ?Extremities: No pitting edema. ? ?ECG:  An ECG dated 06/12/2021 was personally reviewed today and demonstrated:  Atrial fibrillation with PVCs, left anterior fascicular block, IVCD, nonspecific ST-T changes. ? ?Recent Labwork: ? ?March 2023: Hemoglobin 17.9, platelets 271, BUN 16, creatinine 1.6, potassium 4.5 ? ?Other Studies Reviewed Today: ? ?Echocardiogram 05/07/2021: ? 1. Left ventricular ejection fraction, by estimation, is 40%. The left  ?ventricle has mildly decreased function. The left ventricle demonstrates  ?global hypokinesis. Left ventricular diastolic parameters are  ?indeterminate.  ? 2. RV poorly visualized. Grossly RV appears dilated with decreased  ?systolic function. . Right ventricular systolic function was not well  ?visualized. The right ventricular size is not well visualized. There is  ?normal pulmonary artery systolic pressure.  ? 3. Left atrial  size was mildly dilated.  ? 4. Right atrial size was mildly dilated.  ? 5. The mitral valve is normal in structure. No evidence of mitral valve  ?regurgitation. No evidence of mitral stenosis.  ? 6. The aortic valve is tricuspid. Aortic valve regurgitation is not  ?visualized. No aortic stenosis is present.  ? 7. The inferior vena cava is normal in size with greater than 50%  ?respiratory variability, suggesting right atrial pressure of 3 mmHg.  ? ?Assessment and Plan: ? ?1.  HFrEF with nonischemic cardiomyopathy and LVEF most recently 40%.  He is clinically stable with NYHA class II dyspnea and no significant weight gain.  He walks his dog regularly for exercise.  Continue Toprol-XL, Entresto, Farxiga, and Lasix.  Follow-up echocardiogram in 6 months. ? ?2.  Permanent atrial fibrillation, asymptomatic in terms of palpitations and with good heart rate control on current regimen.  CHA2DS2-VASc score is 5.  He remains on Xarelto for stroke prophylaxis, no spontaneous bleeding problems reported. ? ?3.  CKD stage IIIb, following with nephrology.  Most recent creatinine 1.6. ? ?4.  [place] ICD in place with follow-up with Dr. Rayann Heman.  No device shocks or syncope. ? ?Medication Adjustments/Labs and Tests Ordered: ?Current medicines are reviewed at length with the patient today.  Concerns regarding medicines are outlined above.  ? ?Tests Ordered: ?Orders Placed This Encounter  ?Procedures  ? ECHOCARDIOGRAM COMPLETE  ? ? ?Medication Changes: ?No orders of the defined types were placed in this encounter. ? ? ?Disposition:  Follow up  6 months. ? ?Signed, ?Satira Sark, MD, [facility] ?11/10/2021 11:23 AM    ?[facility] ?[place], [place], [place] [address] ?Phone: [phone]; Fax: [phone]  ?

## 2021-11-10 ENCOUNTER — Encounter: Payer: Self-pay | Admitting: Cardiology

## 2021-11-10 ENCOUNTER — Ambulatory Visit (INDEPENDENT_AMBULATORY_CARE_PROVIDER_SITE_OTHER): Payer: Medicare Other | Admitting: Cardiology

## 2021-11-10 VITALS — BP 102/58 | HR 72 | Ht 70.0 in | Wt 188.2 lb

## 2021-11-10 DIAGNOSIS — I428 Other cardiomyopathies: Secondary | ICD-10-CM | POA: Diagnosis not present

## 2021-11-10 DIAGNOSIS — N1832 Chronic kidney disease, stage 3b: Secondary | ICD-10-CM | POA: Diagnosis not present

## 2021-11-10 DIAGNOSIS — I4821 Permanent atrial fibrillation: Secondary | ICD-10-CM | POA: Diagnosis not present

## 2021-11-10 DIAGNOSIS — I5020 Unspecified systolic (congestive) heart failure: Secondary | ICD-10-CM | POA: Diagnosis not present

## 2021-11-10 NOTE — Patient Instructions (Addendum)

## 2021-12-04 ENCOUNTER — Ambulatory Visit (INDEPENDENT_AMBULATORY_CARE_PROVIDER_SITE_OTHER): Payer: Medicare Other

## 2021-12-04 DIAGNOSIS — I428 Other cardiomyopathies: Secondary | ICD-10-CM | POA: Diagnosis not present

## 2021-12-04 LAB — CUP PACEART REMOTE DEVICE CHECK
Battery Remaining Longevity: 120 mo
Battery Remaining Percentage: 100 %
Brady Statistic RV Percent Paced: 3 %
Date Time Interrogation Session: 20230428005100
HighPow Impedance: 48 Ohm
Implantable Lead Implant Date: 20160531
Implantable Lead Location: 753860
Implantable Lead Model: 296
Implantable Lead Serial Number: 129098
Implantable Pulse Generator Implant Date: 20160531
Lead Channel Impedance Value: 386 Ohm
Lead Channel Pacing Threshold Amplitude: 0.8 V
Lead Channel Pacing Threshold Pulse Width: 0.4 ms
Lead Channel Setting Pacing Amplitude: 2.5 V
Lead Channel Setting Pacing Pulse Width: 0.4 ms
Lead Channel Setting Sensing Sensitivity: 0.6 mV
Pulse Gen Serial Number: 206098

## 2021-12-18 NOTE — Progress Notes (Signed)
Remote ICD transmission.   

## 2022-03-05 ENCOUNTER — Ambulatory Visit (INDEPENDENT_AMBULATORY_CARE_PROVIDER_SITE_OTHER): Payer: Medicare Other

## 2022-03-05 DIAGNOSIS — I428 Other cardiomyopathies: Secondary | ICD-10-CM | POA: Diagnosis not present

## 2022-03-05 LAB — CUP PACEART REMOTE DEVICE CHECK
Battery Remaining Longevity: 120 mo
Battery Remaining Percentage: 100 %
Brady Statistic RV Percent Paced: 3 %
Date Time Interrogation Session: 20230728005100
HighPow Impedance: 54 Ohm
Implantable Lead Implant Date: 20160531
Implantable Lead Location: 753860
Implantable Lead Model: 296
Implantable Lead Serial Number: 129098
Implantable Pulse Generator Implant Date: 20160531
Lead Channel Impedance Value: 411 Ohm
Lead Channel Pacing Threshold Amplitude: 0.8 V
Lead Channel Pacing Threshold Pulse Width: 0.4 ms
Lead Channel Setting Pacing Amplitude: 2.5 V
Lead Channel Setting Pacing Pulse Width: 0.4 ms
Lead Channel Setting Sensing Sensitivity: 0.6 mV
Pulse Gen Serial Number: 206098

## 2022-03-14 ENCOUNTER — Other Ambulatory Visit: Payer: Self-pay | Admitting: Cardiology

## 2022-03-24 NOTE — Progress Notes (Signed)
Remote ICD transmission.   

## 2022-04-01 ENCOUNTER — Ambulatory Visit (INDEPENDENT_AMBULATORY_CARE_PROVIDER_SITE_OTHER): Payer: Medicare Other

## 2022-04-01 DIAGNOSIS — I428 Other cardiomyopathies: Secondary | ICD-10-CM | POA: Diagnosis not present

## 2022-04-01 DIAGNOSIS — I5020 Unspecified systolic (congestive) heart failure: Secondary | ICD-10-CM

## 2022-04-01 LAB — ECHOCARDIOGRAM COMPLETE
AR max vel: 1.69 cm2
AV Peak grad: 4.8 mmHg
Ao pk vel: 1.09 m/s
Area-P 1/2: 4.06 cm2
Calc EF: 42.9 %
MV M vel: 3.39 m/s
MV Peak grad: 46 mmHg
S' Lateral: 3.91 cm
Single Plane A2C EF: 43.8 %
Single Plane A4C EF: 42.4 %

## 2022-05-06 ENCOUNTER — Other Ambulatory Visit: Payer: Medicare Other

## 2022-05-11 NOTE — Progress Notes (Unsigned)
Cardiology Office Note  Date: 05/12/2022   ID: Marvin Sanchez, DOB 1934-08-28, MRN [id number]  PCP:  Eber Hong, MD  Cardiologist:  Rozann Lesches, MD Electrophysiologist:  Thompson Grayer, MD   Chief Complaint  Patient presents with   Cardiac follow-up    History of Present Illness: Marvin Sanchez is an 87 y.o. male last seen in April.  He is here for a routine visit.  He has had no change in stamina, NYHA class II dyspnea.  No orthopnea or PND.  Weight has also been stable and he denies any leg swelling.  [place] ICD in place with follow-up by EP.  He does not report any device shocks or syncope.  Follow-up echocardiogram in August showed stable LVEF at approximately 40%.  We went over his medications which are stable and outlined below.  We do not have him on Aldactone in light of current degree of renal insufficiency with CKD stage IIIb.  Most recent creatinine was 1.96 with potassium 4.6.  I personally reviewed his ECG today which shows rate controlled atrial fibrillation with intermittent paced beats, IVCD of left bundle branch block type.  Past Medical History:  Diagnosis Date   Allergic rhinitis    Cardiac defibrillator in place 2016   [place] EL ICD DF4 VR model # [id number], serial [id number]    Cardiomyopathy ([place])    LVEF 25-30%   Chronic atrial fibrillation (HCC)    Chronic systolic heart failure (HCC)    Esophageal polyp    Essential hypertension    GERD (gastroesophageal reflux disease)    History of skin cancer    Squamous cell   Hyperlipidemia    Hypothyroidism    Nonischemic cardiomyopathy (HCC)    Normal coronary arteries at cardiac catheterization 2015   Osteoarthritis    Renal calculi    Type 2 diabetes mellitus ([place])    Vitamin D deficiency     Past Surgical History:  Procedure Laterality Date   BIOPSY  03/14/2020   Procedure: BIOPSY;  Surgeon: Harvel Quale, MD;  [facility];  Service: Gastroenterology;;   duodenal   CARDIAC CATHETERIZATION  09/2013   CARDIAC DEFIBRILLATOR PLACEMENT  01/07/2015   [employer] Dynagen VR ICD implanted by [employer] physicians in [place] PROPOFOL N/A 03/14/2020   Procedure: COLONOSCOPY WITH PROPOFOL;  Surgeon: Harvel Quale, MD;  [facility];  Service: Gastroenterology;  Laterality: N/A;  815   ESOPHAGOGASTRODUODENOSCOPY     ESOPHAGOGASTRODUODENOSCOPY (EGD) WITH PROPOFOL N/A 03/14/2020   Procedure: ESOPHAGOGASTRODUODENOSCOPY (EGD) WITH PROPOFOL;  Surgeon: Harvel Quale, MD;  [facility];  Service: Gastroenterology;  Laterality: N/A;   POLYPECTOMY  03/14/2020   Procedure: POLYPECTOMY;  Surgeon: Montez Morita, Quillian Quince, MD;  [facility];  Service: Gastroenterology;;  gastric, duodenal,ascending   SKIN CANCER REMOVAL      TONSILLECTOMY      Current Outpatient Medications  Medication Sig Dispense Refill   acetaminophen (TYLENOL) 500 MG tablet Take 500 mg by mouth every 6 (six) hours as needed for mild pain or headache.     amLODipine (NORVASC) 2.5 MG tablet Take 2.5 mg by mouth in the morning and at bedtime.      cetirizine (ZYRTEC) 10 MG tablet Take 10 mg by mouth daily.      Cholecalciferol (VITAMIN D-3) 25 MCG (1000 UT) CAPS Take 1,000 Units by mouth  daily.     cromolyn (NASALCROM) 5.2 MG/ACT nasal spray Place 1 spray into both nostrils daily as needed for allergies or rhinitis.     Cyanocobalamin 1000 MCG/ML KIT Inject 1,000 mcg as directed every 30 (thirty) days.     FARXIGA 10 MG TABS tablet Take 10 mg by mouth every morning.     furosemide (LASIX) 20 MG tablet Take 1 tablet by mouth once daily 90 tablet 1   gabapentin (NEURONTIN) 300 MG capsule Take 300 mg by mouth 2 (two) times daily.     levothyroxine (SYNTHROID, LEVOTHROID) 25 MCG tablet Take 25 mcg by mouth daily before breakfast.     metoprolol succinate (TOPROL-XL) 100 MG 24 hr  tablet TAKE 1 TABLET TWICE DAILY 180 tablet 3   omeprazole (PRILOSEC) 20 MG capsule Take 20 mg by mouth every Monday, Wednesday, and Friday.     Rivaroxaban (XARELTO) 15 MG TABS tablet TAKE 1 TABLET DAILY WITH   SUPPER 90 tablet 3   sacubitril-valsartan (ENTRESTO) 24-26 MG Take 1 tablet by mouth 2 (two) times daily. 180 tablet 3   sodium bicarbonate 650 MG tablet Take 650 mg by mouth 2 (two) times daily.     VITRON-C 65-125 MG TABS Take 1 tablet by mouth 2 (two) times daily.     No current facility-administered medications for this visit.   Allergies:  Losartan potassium-hctz   ROS: No orthopnea or PND.  Physical Exam: VS:  BP 114/68   Pulse 65   Ht '5\' 10"'  (1.778 m)   Wt 185 lb 9.6 oz (84.2 kg)   SpO2 95%   BMI 26.63 kg/m , BMI Body mass index is 26.63 kg/m.  Wt Readings from Last 3 Encounters:  05/12/22 185 lb 9.6 oz (84.2 kg)  11/10/21 188 lb 3.2 oz (85.4 kg)  06/12/21 189 lb 9.6 oz (86 kg)    General: Patient appears comfortable at rest. HEENT: Conjunctiva and lids normal. Neck: Supple, no elevated JVP or carotid bruits. Lungs: Clear to auscultation, nonlabored breathing at rest. Cardiac: Irregular, no S3 or significant systolic murmur, no pericardial rub. Extremities: No pitting edema.  ECG:  An ECG dated 06/12/2021 was personally reviewed today and demonstrated:  Atrial fibrillation with PVCs, left anterior fascicular block, IVCD, nonspecific ST-T changes.  Recent Labwork:  April 2023: TSH 3.16, potassium 4.6, BUN 28, creatinine 1.98, AST 17, ALT 16, cholesterol 164, triglycerides 126, HDL 36, LDL 103, hemoglobin 17.4, platelets 245, hemoglobin A1c 6.5% July 2023: BUN 24, creatinine 1.96, potassium 4.6  Other Studies Reviewed Today:  Echocardiogram 04/01/2022:  1. Left ventricular ejection fraction, by estimation, is approximately  40%. The left ventricle has mild to moderately decreased function. The  left ventricle demonstrates global hypokinesis. There is mild  left  ventricular hypertrophy. Left ventricular  diastolic parameters are indeterminate. The average left ventricular  global longitudinal strain is -9.7 %. The global longitudinal strain is  abnormal.   2. Right ventricular systolic function is normal. The right ventricular  size is normal. There is normal pulmonary artery systolic pressure. The  estimated right ventricular systolic pressure is 85.6 mmHg.   3. Left atrial size was mildly dilated.   4. Right atrial size was upper normal.   5. The mitral valve is degenerative. Mild mitral valve regurgitation.   6. The aortic valve is tricuspid. Aortic valve regurgitation is not  visualized.   7. The inferior vena cava is normal in size with greater than 50%  respiratory variability, suggesting right atrial pressure  of 3 mmHg.   Assessment and Plan:  1.  HFrEF with nonischemic cardiomyopathy, LVEF stable at 40% and RV contraction normal.  He is clinically stable with NYHA class II dyspnea.  Continue Toprol-XL, Entresto, Farxiga, and Lasix.  He is not on MRA due to current degree of renal insufficiency.  2.  CKD stage IIIb, creatinine 1.96.  3.  [employer] ICD in place with follow-up by EP.  No device shocks or syncope.  4.  Permanent atrial fibrillation with CHA2DS2-VASc score of 5.  Asymptomatic at this time on Toprol-XL.  He continues on renally adjusted dose Xarelto for stroke prophylaxis.  No spontaneous bleeding problems.  Medication Adjustments/Labs and Tests Ordered: Current medicines are reviewed at length with the patient today.  Concerns regarding medicines are outlined above.   Tests Ordered: Orders Placed This Encounter  Procedures   EKG 12-Lead    Medication Changes: No orders of the defined types were placed in this encounter.   Disposition:  Follow up  6 months.  Signed, Satira Sark, MD, [facility] 05/12/2022 11:17 AM    [place] at [place], [place], [place]  [address] Phone: [phone]; Fax: [phone]

## 2022-05-12 ENCOUNTER — Encounter: Payer: Self-pay | Admitting: Cardiology

## 2022-05-12 ENCOUNTER — Ambulatory Visit: Payer: Medicare Other | Attending: Cardiology | Admitting: Cardiology

## 2022-05-12 VITALS — BP 114/68 | HR 65 | Ht 70.0 in | Wt 185.6 lb

## 2022-05-12 DIAGNOSIS — N1832 Chronic kidney disease, stage 3b: Secondary | ICD-10-CM | POA: Insufficient documentation

## 2022-05-12 DIAGNOSIS — I4821 Permanent atrial fibrillation: Secondary | ICD-10-CM | POA: Diagnosis present

## 2022-05-12 DIAGNOSIS — I5020 Unspecified systolic (congestive) heart failure: Secondary | ICD-10-CM | POA: Insufficient documentation

## 2022-05-12 NOTE — Patient Instructions (Addendum)

## 2022-05-25 ENCOUNTER — Encounter: Payer: Self-pay | Admitting: Cardiovascular Disease

## 2022-06-04 ENCOUNTER — Ambulatory Visit (INDEPENDENT_AMBULATORY_CARE_PROVIDER_SITE_OTHER): Payer: Medicare Other

## 2022-06-04 DIAGNOSIS — I428 Other cardiomyopathies: Secondary | ICD-10-CM

## 2022-06-04 LAB — CUP PACEART REMOTE DEVICE CHECK
Battery Remaining Longevity: 114 mo
Battery Remaining Percentage: 100 %
Brady Statistic RV Percent Paced: 3 %
Date Time Interrogation Session: 20231027011700
HighPow Impedance: 51 Ohm
Implantable Lead Connection Status: 753985
Implantable Lead Implant Date: 20160531
Implantable Lead Location: 753860
Implantable Lead Model: 296
Implantable Lead Serial Number: 129098
Implantable Pulse Generator Implant Date: 20160531
Lead Channel Impedance Value: 462 Ohm
Lead Channel Pacing Threshold Amplitude: 0.8 V
Lead Channel Pacing Threshold Pulse Width: 0.4 ms
Lead Channel Setting Pacing Amplitude: 2.5 V
Lead Channel Setting Pacing Pulse Width: 0.4 ms
Lead Channel Setting Sensing Sensitivity: 0.6 mV
Pulse Gen Serial Number: 206098
Zone Setting Status: 755011

## 2022-06-10 NOTE — Progress Notes (Signed)
Remote ICD transmission.   

## 2022-06-11 ENCOUNTER — Encounter: Payer: Medicare Other | Admitting: Internal Medicine

## 2022-06-21 ENCOUNTER — Other Ambulatory Visit: Payer: Self-pay | Admitting: *Deleted

## 2022-06-21 MED ORDER — ENTRESTO 24-26 MG PO TABS: 1.0000 | ORAL_TABLET | Freq: Two times a day (BID) | ORAL | 3 refills | Status: DC

## 2022-06-22 ENCOUNTER — Other Ambulatory Visit: Payer: Self-pay | Admitting: *Deleted

## 2022-06-22 MED ORDER — ENTRESTO 24-26 MG PO TABS: 1.0000 | ORAL_TABLET | Freq: Two times a day (BID) | ORAL | 3 refills | Status: DC

## 2022-07-09 ENCOUNTER — Telehealth: Payer: Self-pay | Admitting: Cardiology

## 2022-07-09 ENCOUNTER — Encounter: Payer: Medicare Other | Admitting: Internal Medicine

## 2022-07-09 DIAGNOSIS — I4821 Permanent atrial fibrillation: Secondary | ICD-10-CM

## 2022-07-09 MED ORDER — RIVAROXABAN 15 MG PO TABS: ORAL_TABLET | ORAL | 3 refills | Status: DC

## 2022-07-09 NOTE — Telephone Encounter (Signed)
*  STAT* If patient is at the pharmacy, call can be transferred to refill team.   1. Which medications need to be refilled? (please list name of each medication and dose if known)  Rivaroxaban (XARELTO) 15 MG TABS tablet  2. Which pharmacy/location (including street and city if local pharmacy) is medication to be sent to? [facility]  3. Do they need a 30 day or 90 day supply?  90 day supply  [place] with [facility] would like to confirm whether or not the patient should still be taking this medication. She states if so, they will need a new prescription because their current prescription is now expired.

## 2022-07-09 NOTE — Telephone Encounter (Signed)
Prescription refill request for Xarelto received.  Indication: Afib  Last office visit: 05/12/22 Marvin Sanchez)  Weight: 84.2kg Age: 87 Scr: 1.96 (03/03/22)  CrCl: 32.1m/min  Appropriate dose and refill sent to requested pharmacy.

## 2022-07-19 ENCOUNTER — Encounter: Payer: Self-pay | Admitting: *Deleted

## 2022-07-19 NOTE — Progress Notes (Signed)
Fax notification received from [employer] that Entresto 24/26 mg patient assistance approved through 08/09/2023

## 2022-09-03 ENCOUNTER — Ambulatory Visit (INDEPENDENT_AMBULATORY_CARE_PROVIDER_SITE_OTHER): Payer: Medicare Other

## 2022-09-03 DIAGNOSIS — I428 Other cardiomyopathies: Secondary | ICD-10-CM | POA: Diagnosis not present

## 2022-09-03 LAB — CUP PACEART REMOTE DEVICE CHECK
Battery Remaining Longevity: 114 mo
Battery Remaining Percentage: 100 %
Brady Statistic RV Percent Paced: 3 %
Date Time Interrogation Session: 20240126005100
HighPow Impedance: 52 Ohm
Implantable Lead Connection Status: 753985
Implantable Lead Implant Date: 20160531
Implantable Lead Location: 753860
Implantable Lead Model: 296
Implantable Lead Serial Number: 129098
Implantable Pulse Generator Implant Date: 20160531
Lead Channel Impedance Value: 408 Ohm
Lead Channel Pacing Threshold Amplitude: 0.8 V
Lead Channel Pacing Threshold Pulse Width: 0.4 ms
Lead Channel Setting Pacing Amplitude: 2.5 V
Lead Channel Setting Pacing Pulse Width: 0.4 ms
Lead Channel Setting Sensing Sensitivity: 0.6 mV
Pulse Gen Serial Number: 206098
Zone Setting Status: 755011

## 2022-09-20 NOTE — Progress Notes (Signed)
Remote ICD transmission.   

## 2022-09-23 ENCOUNTER — Encounter: Payer: Self-pay | Admitting: Cardiovascular Disease

## 2022-09-23 ENCOUNTER — Ambulatory Visit: Payer: Medicare Other | Attending: Internal Medicine | Admitting: Cardiovascular Disease

## 2022-09-23 VITALS — BP 120/86 | HR 84 | Ht 70.5 in | Wt 186.0 lb

## 2022-09-23 DIAGNOSIS — I5020 Unspecified systolic (congestive) heart failure: Secondary | ICD-10-CM | POA: Insufficient documentation

## 2022-09-23 DIAGNOSIS — I4821 Permanent atrial fibrillation: Secondary | ICD-10-CM | POA: Diagnosis present

## 2022-09-23 DIAGNOSIS — Z95810 Presence of automatic (implantable) cardiac defibrillator: Secondary | ICD-10-CM | POA: Diagnosis present

## 2022-09-23 LAB — CUP PACEART INCLINIC DEVICE CHECK
Date Time Interrogation Session: 20240215140022
HighPow Impedance: 59 Ohm
Implantable Lead Connection Status: 753985
Implantable Lead Implant Date: 20160531
Implantable Lead Location: 753860
Implantable Lead Model: 296
Implantable Lead Serial Number: 129098
Implantable Pulse Generator Implant Date: 20160531
Lead Channel Impedance Value: 428 Ohm
Lead Channel Pacing Threshold Amplitude: 0.9 V
Lead Channel Pacing Threshold Pulse Width: 0.4 ms
Lead Channel Sensing Intrinsic Amplitude: 6.8 mV
Lead Channel Setting Pacing Amplitude: 2.5 V
Lead Channel Setting Pacing Pulse Width: 0.4 ms
Lead Channel Setting Sensing Sensitivity: 0.6 mV
Pulse Gen Serial Number: 206098
Zone Setting Status: 755011

## 2022-09-23 NOTE — Patient Instructions (Signed)
Medication Instructions:  Continue all current medications.  Labwork: none  Testing/Procedures: none  Follow-Up: 1 year - Dr.  Mealor    Any Other Special Instructions Will Be Listed Below (If Applicable).   If you need a refill on your cardiac medications before your next appointment, please call your pharmacy.  

## 2022-09-23 NOTE — Progress Notes (Signed)
PCP: Eber Hong, MD Primary Cardiologist: Dr Domenic Polite Primary EP: Dr Bertrum Sol is a 88 y.o. male who presents today for routine electrophysiology followup.  Since last being seen in our clinic, the patient reports doing very well.  Today, he denies symptoms of palpitations, chest pain, shortness of breath,  lower extremity edema, dizziness, presyncope, syncope, or ICD shocks.  The patient is otherwise without complaint today.   Past Medical History:  Diagnosis Date   Allergic rhinitis    Cardiac defibrillator in place 2016   [place] EL ICD DF4 VR model # [id number], serial [id number]    Cardiomyopathy ([place])    LVEF 25-30%   Chronic atrial fibrillation (HCC)    Chronic systolic heart failure (HCC)    Esophageal polyp    Essential hypertension    GERD (gastroesophageal reflux disease)    History of skin cancer    Squamous cell   Hyperlipidemia    Hypothyroidism    Nonischemic cardiomyopathy (HCC)    Normal coronary arteries at cardiac catheterization 2015   Osteoarthritis    Renal calculi    Type 2 diabetes mellitus ([place])    Vitamin D deficiency    Past Surgical History:  Procedure Laterality Date   BIOPSY  03/14/2020   Procedure: BIOPSY;  Surgeon: Harvel Quale, MD;  [facility];  Service: Gastroenterology;;  duodenal   CARDIAC CATHETERIZATION  09/2013   CARDIAC DEFIBRILLATOR PLACEMENT  01/07/2015   [employer] Dynagen VR ICD implanted by [employer] physicians in [place] PROPOFOL N/A 03/14/2020   Procedure: COLONOSCOPY WITH PROPOFOL;  Surgeon: Harvel Quale, MD;  [facility];  Service: Gastroenterology;  Laterality: N/A;  815   ESOPHAGOGASTRODUODENOSCOPY     ESOPHAGOGASTRODUODENOSCOPY (EGD) WITH PROPOFOL N/A 03/14/2020   Procedure: ESOPHAGOGASTRODUODENOSCOPY (EGD) WITH PROPOFOL;  Surgeon: Harvel Quale, MD;  [facility];   Service: Gastroenterology;  Laterality: N/A;   POLYPECTOMY  03/14/2020   Procedure: POLYPECTOMY;  Surgeon: Monte[place] Morita, Quillian Quince, MD;  [facility];  Service: Gastroenterology;;  gastric, duodenal,ascending   SKIN CANCER REMOVAL      TONSILLECTOMY      ROS- all systems are reviewed and negative except as per HPI above  Current Outpatient Medications  Medication Sig Dispense Refill   acetaminophen (TYLENOL) 500 MG tablet Take 500 mg by mouth every 6 (six) hours as needed for mild pain or headache.     amLODipine (NORVASC) 2.5 MG tablet Take 2.5 mg by mouth in the morning and at bedtime.      cetirizine (ZYRTEC) 10 MG tablet Take 10 mg by mouth daily.      Cholecalciferol (VITAMIN D-3) 25 MCG (1000 UT) CAPS Take 1,000 Units by mouth daily.     cromolyn (NASALCROM) 5.2 MG/ACT nasal spray Place 1 spray into both nostrils daily as needed for allergies or rhinitis.     Cyanocobalamin 1000 MCG/ML KIT Inject 1,000 mcg as directed every 30 (thirty) days.     FARXIGA 10 MG TABS tablet Take 10 mg by mouth every morning.     furosemide (LASIX) 20 MG tablet Take 1 tablet by mouth once daily 90 tablet 1   gabapentin (NEURONTIN) 300 MG capsule Take 300 mg by mouth 2 (two) times daily.     levothyroxine (SYNTHROID, LEVOTHROID) 25 MCG tablet Take 25 mcg by mouth daily before breakfast.  metoprolol succinate (TOPROL-XL) 100 MG 24 hr tablet TAKE 1 TABLET TWICE DAILY 180 tablet 3   omeprazole (PRILOSEC) 20 MG capsule Take 20 mg by mouth every Monday, Wednesday, and Friday.     Rivaroxaban (XARELTO) 15 MG TABS tablet TAKE 1 TABLET DAILY WITH  SUPPER 90 tablet 3   sacubitril-valsartan (ENTRESTO) 24-26 MG Take 1 tablet by mouth 2 (two) times daily. 180 tablet 3   sodium bicarbonate 650 MG tablet Take 650 mg by mouth 2 (two) times daily.     VITRON-C 65-125 MG TABS Take 1 tablet by mouth 2 (two) times daily.     No current facility-administered medications for this visit.    Physical  Exam: There were no vitals filed for this visit.   Gen: Appears comfortable, well-nourished CV: RRR, no dependent edema The device site is normal -- no tenderness, edema, drainage, redness, threatened erosion. Pulm: breathing easily   ICD interrogation- reviewed in detail today,  See PACEART report  Echo 05/07/21- EF 40%   Wt Readings from Last 3 Encounters:  05/12/22 185 lb 9.6 oz (84.2 kg)  11/10/21 188 lb 3.2 oz (85.4 kg)  06/12/21 189 lb 9.6 oz (86 kg)    Assessment and Plan:  1.  Chronic systolic dysfunction/ nonischemic CM euvolemic today Stable on an appropriate medical regimen  2. ICD Normal ICD function See Claudia Desanctis Art report No changes today he is not device dependant today  3. Permanent atrial fibrillation Rate controlled Could probably reduce metoprolol according to histograms.  Will consider in the future. On xarelto for stroke prevention (chads2vasc score of 5) Labs 05/06/21 reviewed  4. HTN Stable No change required today   Risks, benefits and potential toxicities for medications prescribed and/or refilled reviewed with patient today.   Return in a year Follow-up with Dr Domenic Polite as scheduled  Melida Quitter, MD 09/23/2022 11:16 AM

## 2022-10-03 ENCOUNTER — Other Ambulatory Visit: Payer: Self-pay | Admitting: Cardiology

## 2022-11-13 NOTE — Progress Notes (Unsigned)
    Cardiology Office Note  Date: 11/13/2022   ID: Marvin Sanchez, DOB April 05, 1935, MRN [id number]  History of Present Illness: Marvin Sanchez is an 88 y.o. male last seen in October 2023.  [employer] ICD in place with followed by Dr. Nelly Sanchez.  Device interrogation in February showed overall normal function.  Physical Exam: VS:  There were no vitals taken for this visit., BMI There is no height or weight on file to calculate BMI.  Wt Readings from Last 3 Encounters:  09/23/22 186 lb (84.4 kg)  05/12/22 185 lb 9.6 oz (84.2 kg)  11/10/21 188 lb 3.2 oz (85.4 kg)    General: Patient appears comfortable at rest. HEENT: Conjunctiva and lids normal, oropharynx clear with moist mucosa. Neck: Supple, no elevated JVP or carotid bruits, no thyromegaly. Lungs: Clear to auscultation, nonlabored breathing at rest. Cardiac: Regular rate and rhythm, no S3 or significant systolic murmur, no pericardial rub. Abdomen: Soft, nontender, no hepatomegaly, bowel sounds present, no guarding or rebound. Extremities: No pitting edema, distal pulses 2+. Skin: Warm and dry. Musculoskeletal: No kyphosis. Neuropsychiatric: Alert and oriented x3, affect grossly appropriate.  ECG:  An ECG dated 05/12/2022 was personally reviewed today and demonstrated:  Atrial fibrillation with IVCD, intermittent ventricular pacing and PVCs.Loney Sanchez:  October 2023: Potassium 4.8, BUN 25, creatinine 2.0, AST 18, ALT 19, cholesterol 165, HDL 37, LDL 99, triglycerides 137, hemoglobin 18.5, platelets 247  Other Studies Reviewed Today:  Echocardiogram 04/01/2022:  1. Left ventricular ejection fraction, by estimation, is approximately  40%. The left ventricle has mild to moderately decreased function. The  left ventricle demonstrates global hypokinesis. There is mild left  ventricular hypertrophy. Left ventricular  diastolic parameters are indeterminate. The average left ventricular  global longitudinal strain is -9.7 %. The  global longitudinal strain is  abnormal.   2. Right ventricular systolic function is normal. The right ventricular  size is normal. There is normal pulmonary artery systolic pressure. The  estimated right ventricular systolic pressure is 26.6 mmHg.   3. Left atrial size was mildly dilated.   4. Right atrial size was upper normal.   5. The mitral valve is degenerative. Mild mitral valve regurgitation.   6. The aortic valve is tricuspid. Aortic valve regurgitation is not  visualized.   7. The inferior vena cava is normal in size with greater than 50%  respiratory variability, suggesting right atrial pressure of 3 mmHg.   Assessment and Plan:  1.  HFmrEF with history of nonischemic cardiomyopathy, LVEF approximately 40% by echocardiogram in August 2023.  2.  [employer] ICD in place with follow-up by Dr. Nelly Sanchez.  3.  Permanent atrial fibrillation with CHA2DS2-VASc score of 5.  He is on Xarelto for stroke prophylaxis.  4.  CKD stage IIIb, recent creatinine 2.0.  Disposition:  Follow up {follow up:15908}  Signed, Jonelle Sidle, M.D., F.A.C.C. [facility] at [facility]

## 2022-11-17 ENCOUNTER — Encounter: Payer: Self-pay | Admitting: Cardiology

## 2022-11-17 ENCOUNTER — Ambulatory Visit: Payer: Medicare Other | Attending: Cardiology | Admitting: Cardiology

## 2022-11-17 VITALS — BP 130/82 | HR 84 | Ht 70.5 in | Wt 184.4 lb

## 2022-11-17 DIAGNOSIS — N1832 Chronic kidney disease, stage 3b: Secondary | ICD-10-CM | POA: Insufficient documentation

## 2022-11-17 DIAGNOSIS — I5022 Chronic systolic (congestive) heart failure: Secondary | ICD-10-CM | POA: Diagnosis present

## 2022-11-17 DIAGNOSIS — Z95810 Presence of automatic (implantable) cardiac defibrillator: Secondary | ICD-10-CM | POA: Diagnosis present

## 2022-11-17 NOTE — Patient Instructions (Addendum)
Medication Instructions:  Your physician recommends that you continue on your current medications as directed. Please refer to the Current Medication list given to you today.  Labwork: none  Testing/Procedures: Your physician has requested that you have an echocardiogram in 6 months just before your next visit. Echocardiography is a painless test that uses sound waves to create images of your heart. It provides your doctor with information about the size and shape of your heart and how well your heart's chambers and valves are working. This procedure takes approximately one hour. There are no restrictions for this procedure. Please do NOT wear cologne, perfume, aftershave, or lotions (deodorant is allowed). Please arrive 15 minutes prior to your appointment time.  Follow-Up: Your physician recommends that you schedule a follow-up appointment in: 6 months  Any Other Special Instructions Will Be Listed Below (If Applicable).  If you need a refill on your cardiac medications before your next appointment, please call your pharmacy. 

## 2022-12-03 ENCOUNTER — Ambulatory Visit (INDEPENDENT_AMBULATORY_CARE_PROVIDER_SITE_OTHER): Payer: Medicare Other

## 2022-12-03 DIAGNOSIS — I428 Other cardiomyopathies: Secondary | ICD-10-CM | POA: Diagnosis not present

## 2022-12-03 LAB — CUP PACEART REMOTE DEVICE CHECK
Battery Remaining Longevity: 114 mo
Battery Remaining Percentage: 98 %
Brady Statistic RV Percent Paced: 4 %
Date Time Interrogation Session: 20240426005200
HighPow Impedance: 48 Ohm
Implantable Lead Connection Status: 753985
Implantable Lead Implant Date: 20160531
Implantable Lead Location: 753860
Implantable Lead Model: 296
Implantable Lead Serial Number: 129098
Implantable Pulse Generator Implant Date: 20160531
Lead Channel Impedance Value: 415 Ohm
Lead Channel Pacing Threshold Amplitude: 0.9 V
Lead Channel Pacing Threshold Pulse Width: 0.4 ms
Lead Channel Setting Pacing Amplitude: 2.5 V
Lead Channel Setting Pacing Pulse Width: 0.4 ms
Lead Channel Setting Sensing Sensitivity: 0.6 mV
Pulse Gen Serial Number: 206098
Zone Setting Status: 755011

## 2022-12-28 NOTE — Progress Notes (Signed)
Remote ICD transmission.   

## 2023-03-04 ENCOUNTER — Ambulatory Visit (INDEPENDENT_AMBULATORY_CARE_PROVIDER_SITE_OTHER): Payer: Medicare Other

## 2023-03-04 DIAGNOSIS — I428 Other cardiomyopathies: Secondary | ICD-10-CM

## 2023-03-04 LAB — CUP PACEART REMOTE DEVICE CHECK
Battery Remaining Longevity: 108 mo
Battery Remaining Percentage: 95 %
Brady Statistic RV Percent Paced: 5 %
Date Time Interrogation Session: 20240726005100
HighPow Impedance: 46 Ohm
Implantable Lead Connection Status: 753985
Implantable Lead Implant Date: 20160531
Implantable Lead Location: 753860
Implantable Lead Model: 296
Implantable Lead Serial Number: 129098
Implantable Pulse Generator Implant Date: 20160531
Lead Channel Impedance Value: 394 Ohm
Lead Channel Pacing Threshold Amplitude: 0.9 V
Lead Channel Pacing Threshold Pulse Width: 0.4 ms
Lead Channel Setting Pacing Amplitude: 2.5 V
Lead Channel Setting Pacing Pulse Width: 0.4 ms
Lead Channel Setting Sensing Sensitivity: 0.6 mV
Pulse Gen Serial Number: 206098
Zone Setting Status: 755011

## 2023-03-11 NOTE — Progress Notes (Signed)
Remote ICD transmission.   

## 2023-04-21 ENCOUNTER — Telehealth: Payer: Self-pay | Admitting: Cardiology

## 2023-04-21 MED ORDER — FUROSEMIDE 20 MG PO TABS: 20.0000 mg | ORAL_TABLET | ORAL | Status: DC | PRN

## 2023-04-21 NOTE — Telephone Encounter (Signed)
Pt c/o medication issue:  1. Name of Medication: Furosemide  2. How are you currently taking this medication (dosage and times per day)?   3. Are you having a reaction (difficulty breathing--STAT)?   4. What is your medication issue? Wife wants to know if it is necessary for the patient to take this medicine everyday?Se said yesterday, he became dehydrated and his blood pressure dropped

## 2023-04-21 NOTE — Telephone Encounter (Signed)
Patient verbalized understanding and Rx updated

## 2023-05-19 ENCOUNTER — Ambulatory Visit: Payer: Medicare Other | Attending: Cardiology

## 2023-05-19 DIAGNOSIS — I5022 Chronic systolic (congestive) heart failure: Secondary | ICD-10-CM | POA: Diagnosis not present

## 2023-05-23 ENCOUNTER — Encounter: Payer: Self-pay | Admitting: Nurse Practitioner

## 2023-05-23 ENCOUNTER — Ambulatory Visit: Payer: Medicare Other | Attending: Nurse Practitioner | Admitting: Nurse Practitioner

## 2023-05-23 VITALS — BP 100/60 | HR 54 | Ht 70.0 in | Wt 180.4 lb

## 2023-05-23 DIAGNOSIS — Z95810 Presence of automatic (implantable) cardiac defibrillator: Secondary | ICD-10-CM | POA: Insufficient documentation

## 2023-05-23 DIAGNOSIS — I428 Other cardiomyopathies: Secondary | ICD-10-CM | POA: Diagnosis not present

## 2023-05-23 DIAGNOSIS — I10 Essential (primary) hypertension: Secondary | ICD-10-CM | POA: Diagnosis not present

## 2023-05-23 DIAGNOSIS — N1832 Chronic kidney disease, stage 3b: Secondary | ICD-10-CM | POA: Insufficient documentation

## 2023-05-23 DIAGNOSIS — I4821 Permanent atrial fibrillation: Secondary | ICD-10-CM | POA: Insufficient documentation

## 2023-05-23 DIAGNOSIS — I5022 Chronic systolic (congestive) heart failure: Secondary | ICD-10-CM | POA: Diagnosis present

## 2023-05-23 LAB — ECHOCARDIOGRAM COMPLETE
AR max vel: 2.4 cm2
AV Area VTI: 2.37 cm2
AV Area mean vel: 2.5 cm2
AV Mean grad: 3 mm[Hg]
AV Peak grad: 4.2 mm[Hg]
Ao pk vel: 1.03 m/s
Calc EF: 26.3 %
MV VTI: 2.11 cm2
S' Lateral: 4.1 cm
Single Plane A2C EF: 21.9 %
Single Plane A4C EF: 33 %

## 2023-05-23 MED ORDER — METOPROLOL SUCCINATE ER 100 MG PO TB24: 100.0000 mg | ORAL_TABLET | Freq: Every day | ORAL | 1 refills | Status: DC

## 2023-05-23 NOTE — Progress Notes (Signed)
Cardiology Office Note:  .   Date:  05/23/2023  ID:  Marvin Sanchez, DOB [birth year], MRN [id number] PCP: Kathlee Nations, MD  [facility] Providers Cardiologist:  Nona Dell, MD Electrophysiologist:  Hillis Range, MD (Inactive)    History of Present Illness: .   Marvin Sanchez is a 88 y.o. male with a PMH of HFmrEF, NICM, permanent A-fib, CKD stage IIIb (Dr. Lowell Guitar), HTN, hx of anemia, polycythemia vera, and history of ICD (followed by EP), peripheral neuropathy, who presents today for 68-month follow-up appointment.  Last seen by Dr. Diona Browner on November 17, 2022.  He was overall doing well at the time.  He was very active and walked his dog every day, denied any change in his stamina.  Today he presents for 57-month follow-up appointment with his wife.  Overall doing well.  Wife has noticed recently soft/lower BP's, wants to know if BP medication needs to be adjusted.  Patient is doing well today.  Denies any chest pain, shortness of breath, palpitations, syncope, presyncope, dizziness, orthopnea, PND, swelling or significant weight changes, acute bleeding, or claudication.  ROS: Negative.  See HPI.  Studies Reviewed: Marland Kitchen    EKG:  EKG Interpretation Date/Time:  Monday May 23 2023 10:55:43 EDT Ventricular Rate:  57 PR Interval:    QRS Duration:  124 QT Interval:  438 QTC Calculation: 426 R Axis:   -87  Text Interpretation: Atrial fibrillation with slow ventricular response with premature ventricular or aberrantly conducted complexes Left axis deviation Left bundle branch block When compared with ECG of 19-Jun-2020 17:19, Vent. rate has decreased BY  44 BPM Left bundle branch block is now Present Criteria for Anterior infarct are no longer Present Criteria for Anterolateral infarct are no longer Present Confirmed by Sharlene Dory [phone]) on 05/23/2023 11:11:24 AM   Echo 03/2022: 1. Left ventricular ejection fraction, by estimation, is approximately  40%. The left ventricle has  mild to moderately decreased function. The left ventricle demonstrates global hypokinesis. There is mild left  ventricular hypertrophy. Left ventricular  diastolic parameters are indeterminate. The average left ventricular  global longitudinal strain is -9.7 %. The global longitudinal strain is  abnormal.   2. Right ventricular systolic function is normal. The right ventricular  size is normal. There is normal pulmonary artery systolic pressure. The estimated right ventricular systolic pressure is 26.6 mmHg.   3. Left atrial size was mildly dilated.   4. Right atrial size was upper normal.   5. The mitral valve is degenerative. Mild mitral valve regurgitation.   6. The aortic valve is tricuspid. Aortic valve regurgitation is not  visualized.   7. The inferior vena cava is normal in size with greater than 50%  respiratory variability, suggesting right atrial pressure of 3 mmHg.   Comparison(s): No significant change from prior study. Prior images  reviewed side by side.     Physical Exam:   VS:  BP 100/60 (BP Location: Right Arm)   Pulse (!) 54   Ht 5\' 10"  (1.778 m)   Wt 180 lb 6.4 oz (81.8 kg)   SpO2 96%   BMI 25.88 kg/m    Wt Readings from Last 3 Encounters:  05/23/23 180 lb 6.4 oz (81.8 kg)  11/17/22 184 lb 6.4 oz (83.6 kg)  09/23/22 186 lb (84.4 kg)    GEN: Well nourished, well developed in no acute distress NECK: No JVD; No carotid bruits CARDIAC: S1/S2, irregular irregular rhythm, no murmurs, rubs, gallops RESPIRATORY:  Clear to auscultation  without rales, wheezing or rhonchi  EXTREMITIES:  No edema; No deformity   ASSESSMENT AND PLAN: .    HFmrEF, NICM Stage C, NYHA class I symptoms. NICM.  EF 03/2022 40%.  History of ICD. Euvolemic and well compensated on exam.  Reducing metoprolol succinate due to soft BPs.  Will reduce metoprolol succinate to 100 mg daily.  Continue Farxiga, Lasix PRN, and Entresto.  Will provide patient assistance for Entresto. Low sodium diet, fluid  restriction <2L, and daily weights encouraged. Educated to contact our office for weight gain of 2 lbs overnight or 5 lbs in one week.  Recent echo from last week is pending.  Permanent A-fib Denies any tachycardia or palpitations.  Reducing metoprolol succinate as mentioned above.  Denies any bleeding issues on Xarelto.  He is on appropriate dosage of Xarelto.  Continue Xarelto 50 mg daily for stroke prevention. Heart healthy diet and regular cardiovascular exercise encouraged.   3. HTN Blood pressure soft recently.  Reducing metoprolol succinate as mentioned above.  Will stop amlodipine.  Continue rest of medication regimen. Discussed to monitor BP at home at least 2 hours after medications and sitting for 5-10 minutes. Heart healthy diet and regular cardiovascular exercise encouraged.   4. ICD Denies any shocks or issues.  Normal device function seen with the last remote device check.  Continue follow-up with EP as scheduled.  5. CKD stage 3b Labs from June 2024 were stable.  Avoid nephrotoxic agents.  Continue follow-up with PCP and nephrology as scheduled.  Dispo: Requested that most recent labs will be faxed to our office when these are available.  Follow-up with me/APP in 6 months or sooner if anything changes.  Signed, Sharlene Dory, NP

## 2023-05-23 NOTE — Patient Instructions (Addendum)
Medication Instructions:  Your physician has recommended you make the following change in your medication:  Please Stop taking your Amlodipine  Please reduce Metoprolol to 100 Mg Daily  Continue all other medications as prescribed   Labwork: Have PCP send them to [place]   Testing/Procedures: None  Follow-Up: Your physician recommends that you schedule a follow-up appointment in: 6 Months   Any Other Special Instructions Will Be Listed Below (If Applicable).  If you need a refill on your cardiac medications before your next appointment, please call your pharmacy.

## 2023-05-27 ENCOUNTER — Telehealth: Payer: Self-pay | Admitting: *Deleted

## 2023-05-27 ENCOUNTER — Other Ambulatory Visit: Payer: Self-pay | Admitting: *Deleted

## 2023-05-27 MED ORDER — ENTRESTO 24-26 MG PO TABS: 1.0000 | ORAL_TABLET | Freq: Two times a day (BID) | ORAL | 3 refills | Status: DC

## 2023-05-27 NOTE — Telephone Encounter (Signed)
2025 [employer] PAF application left at office for [employer] assistance. Patient sent 2023 Tax statement and will need to send updated Tax statement for 2024 in the new year of 2025. Application left at nurse desk and will complete once 2024 Tax documents or 2025 proof of income are available.

## 2023-06-01 ENCOUNTER — Other Ambulatory Visit: Payer: Self-pay

## 2023-06-01 ENCOUNTER — Emergency Department (HOSPITAL_COMMUNITY)
Admission: EM | Admit: 2023-06-01 | Discharge: 2023-06-01 | Disposition: A | Discharge status: Home / Self Care | Payer: Medicare Other | Attending: Emergency Medicine | Admitting: Emergency Medicine

## 2023-06-01 ENCOUNTER — Emergency Department (HOSPITAL_COMMUNITY): Payer: Medicare Other

## 2023-06-01 ENCOUNTER — Encounter (HOSPITAL_COMMUNITY): Payer: Self-pay | Admitting: Radiology

## 2023-06-01 DIAGNOSIS — W01198A Fall on same level from slipping, tripping and stumbling with subsequent striking against other object, initial encounter: Secondary | ICD-10-CM | POA: Insufficient documentation

## 2023-06-01 DIAGNOSIS — Z79899 Other long term (current) drug therapy: Secondary | ICD-10-CM | POA: Diagnosis not present

## 2023-06-01 DIAGNOSIS — E1140 Type 2 diabetes mellitus with diabetic neuropathy, unspecified: Secondary | ICD-10-CM | POA: Diagnosis not present

## 2023-06-01 DIAGNOSIS — W19XXXA Unspecified fall, initial encounter: Secondary | ICD-10-CM

## 2023-06-01 DIAGNOSIS — S0003XA Contusion of scalp, initial encounter: Secondary | ICD-10-CM | POA: Diagnosis not present

## 2023-06-01 DIAGNOSIS — I110 Hypertensive heart disease with heart failure: Secondary | ICD-10-CM | POA: Diagnosis not present

## 2023-06-01 DIAGNOSIS — S0990XA Unspecified injury of head, initial encounter: Secondary | ICD-10-CM | POA: Diagnosis present

## 2023-06-01 DIAGNOSIS — I509 Heart failure, unspecified: Secondary | ICD-10-CM | POA: Diagnosis not present

## 2023-06-01 NOTE — ED Provider Notes (Signed)
[place] EMERGENCY DEPARTMENT AT [facility] Provider Note   CSN: [id number] Arrival date & time: 06/01/23  1123     History  Chief Complaint  Patient presents with   Nohl Trzcinski is a 88 y.o. male.   Fall Associated symptoms include headaches.  Patient presents after a fall.  Medical history includes atrial fibrillation, CHF, GERD, HTN, HLD, DM.  He has chronic neuropathy in his legs.  During middle of the night, at around 2 AM, he had some paresthesias in his legs.  He sat up on the edge of the bed to help relieve this.  While sitting on edge of bed, he fell forward.  He did strike his head on the ground.  He has since had a bruise on right temporal area.  This area is mildly painful to him.  He denies any other areas of discomfort.  He is on Xarelto.     Home Medications Prior to Admission medications   Medication Sig Start Date End Date Taking? Authorizing Provider  acetaminophen (TYLENOL) 500 MG tablet Take 500 mg by mouth every 6 (six) hours as needed for mild pain or headache.    [provider]  cetirizine (ZYRTEC) 10 MG tablet Take 10 mg by mouth daily.     [provider]  Cholecalciferol (VITAMIN D-3) 25 MCG (1000 UT) CAPS Take 1,000 Units by mouth daily.    [provider]  cromolyn (NASALCROM) 5.2 MG/ACT nasal spray Place 1 spray into both nostrils daily as needed for allergies or rhinitis.    [provider]  Cyanocobalamin 1000 MCG/ML KIT Inject 1,000 mcg as directed every 30 (thirty) days.    [provider]  FARXIGA 10 MG TABS tablet Take 10 mg by mouth every morning. 11/03/21   [provider]  furosemide (LASIX) 20 MG tablet Take 1 tablet (20 mg total) by mouth as needed (swelling or weight gain). 04/21/23   Jonelle Sidle, MD  gabapentin (NEURONTIN) 300 MG capsule Take 300 mg by mouth 2 (two) times daily.    [provider]  levothyroxine (SYNTHROID, LEVOTHROID) 25 MCG tablet  Take 25 mcg by mouth daily before breakfast.    [provider]  metoprolol succinate (TOPROL-XL) 100 MG 24 hr tablet Take 1 tablet (100 mg total) by mouth daily. Take with or immediately following a meal. 05/23/23   Sharlene Dory, NP  omeprazole (PRILOSEC) 20 MG capsule Take 20 mg by mouth every Monday, Wednesday, and Friday.    [provider]  Rivaroxaban (XARELTO) 15 MG TABS tablet TAKE 1 TABLET DAILY WITH  SUPPER 07/09/22   Jonelle Sidle, MD  sacubitril-valsartan (ENTRESTO) 24-26 MG Take 1 tablet by mouth 2 (two) times daily. 05/27/23   Jonelle Sidle, MD  sodium bicarbonate 650 MG tablet Take 650 mg by mouth daily. 09/07/21   [provider]      Allergies    Losartan potassium-hctz    Review of Systems   Review of Systems  Neurological:  Positive for headaches.  All other systems reviewed and are negative.   Physical Exam Updated Vital Signs BP (!) 138/96   Pulse 77   Temp (!) 97.5 F (36.4 C) (Oral)   Resp 20   Ht 5\' 10"  (1.778 m)   Wt 81.6 kg   SpO2 98%   BMI 25.83 kg/m  Physical Exam Vitals and nursing note reviewed.  Constitutional:      General: He is  not in acute distress.    Appearance: Normal appearance. He is well-developed. He is not ill-appearing, toxic-appearing or diaphoretic.  HENT:     Head: Normocephalic.     Comments: Bruising to right temporal area    Right Ear: External ear normal.     Left Ear: External ear normal.     Nose: Nose normal.     Mouth/Throat:     Mouth: Mucous membranes are moist.  Eyes:     Extraocular Movements: Extraocular movements intact.     Conjunctiva/sclera: Conjunctivae normal.  Cardiovascular:     Rate and Rhythm: Normal rate and regular rhythm.  Pulmonary:     Effort: Pulmonary effort is normal. No respiratory distress.  Abdominal:     General: There is no distension.     Palpations: Abdomen is soft.  Musculoskeletal:        General: No swelling.     Cervical back: Normal  range of motion and neck supple.  Skin:    General: Skin is warm and dry.  Neurological:     General: No focal deficit present.     Mental Status: He is alert and oriented to person, place, and time.     Cranial Nerves: No cranial nerve deficit.     Sensory: No sensory deficit.     Motor: No weakness.     Coordination: Coordination normal.     Gait: Gait normal.  Psychiatric:        Mood and Affect: Mood normal.        Behavior: Behavior normal.     ED Results / Procedures / Treatments   Labs (all labs ordered are listed, but only abnormal results are displayed) Labs Reviewed - No data to display  EKG EKG Interpretation Date/Time:  Wednesday June 01 2023 11:43:18 EDT Ventricular Rate:  81 PR Interval:    QRS Duration:  135 QT Interval:  428 QTC Calculation: 497 R Axis:   257  Text Interpretation: Atrial fibrillation Nonspecific IVCD with LAD Anterior infarct, old Confirmed by Gloris Manchester [phone]) on 06/01/2023 2:54:41 PM  Radiology DG Chest Port 1 View  Result Date: 06/01/2023 CLINICAL DATA:  Trauma. EXAM: PORTABLE CHEST 1 VIEW COMPARISON:  None Available. FINDINGS: The heart size and mediastinal contours are within normal limits. Left-sided defibrillator is noted. Right lung is clear. Probable minimal left perihilar subsegmental atelectasis is noted. The visualized skeletal structures are unremarkable. IMPRESSION: Probable minimal left perihilar subsegmental atelectasis. Electronically Signed   By: Lupita Raider M.D.   On: 06/01/2023 14:36   CT HEAD WO CONTRAST  Result Date: 06/01/2023 CLINICAL DATA:  Head trauma, moderate-severe; Polytrauma, blunt EXAM: CT HEAD WITHOUT CONTRAST CT CERVICAL SPINE WITHOUT CONTRAST TECHNIQUE: Multidetector CT imaging of the head and cervical spine was performed following the standard protocol without intravenous contrast. Multiplanar CT image reconstructions of the cervical spine were also generated. RADIATION DOSE REDUCTION: This exam was  performed according to the departmental dose-optimization program which includes automated exposure control, adjustment of the mA and/or kV according to patient size and/or use of iterative reconstruction technique. COMPARISON:  None Available. FINDINGS: CT HEAD FINDINGS Brain: Low-density extra-axial fluid collections along the right cerebral convexity is favored to represent prominent subarachnoid spaces related to volume loss. Low-density fluid collection along the left cerebral convexity could represent a chronic subdural hematoma measuring 2 mm in thickness. No hemorrhage. No hydrocephalus. No CT evidence of an acute cortical infarct. No mass effect. No mass lesion. Vascular: No hyperdense vessel or  unexpected calcification. Skull: Normal. Negative for fracture or focal lesion. Sinuses/Orbits: No middle ear or mastoid effusion. Paranasal sinuses are clear. Bilateral lens replacement. Orbits are otherwise unremarkable. Other: None. CT CERVICAL SPINE FINDINGS Alignment: Trace retrolisthesis of C5 on C6. Skull base and vertebrae: No acute fracture. No primary bone lesion or focal pathologic process. Soft tissues and spinal canal: No prevertebral fluid or swelling. No visible canal hematoma. Disc levels:  No CT evidence of high-grade spinal canal stenosis. Upper chest: Negative. Other: None IMPRESSION: 1. Low-density extra-axial fluid collections along the right cerebral convexity is favored to represent prominent subarachnoid spaces related to volume loss. Low-density fluid collection along the left cerebral convexity could represent a chronic subdural hematoma measuring 2 mm in thickness. No mass effect. Recommend short term follow up CT to ensure stability. 2. No acute fracture or traumatic subluxation of the cervical spine. Electronically Signed   By: Lorenza Cambridge M.D.   On: 06/01/2023 14:22   CT CERVICAL SPINE WO CONTRAST  Result Date: 06/01/2023 CLINICAL DATA:  Head trauma, moderate-severe; Polytrauma,  blunt EXAM: CT HEAD WITHOUT CONTRAST CT CERVICAL SPINE WITHOUT CONTRAST TECHNIQUE: Multidetector CT imaging of the head and cervical spine was performed following the standard protocol without intravenous contrast. Multiplanar CT image reconstructions of the cervical spine were also generated. RADIATION DOSE REDUCTION: This exam was performed according to the departmental dose-optimization program which includes automated exposure control, adjustment of the mA and/or kV according to patient size and/or use of iterative reconstruction technique. COMPARISON:  None Available. FINDINGS: CT HEAD FINDINGS Brain: Low-density extra-axial fluid collections along the right cerebral convexity is favored to represent prominent subarachnoid spaces related to volume loss. Low-density fluid collection along the left cerebral convexity could represent a chronic subdural hematoma measuring 2 mm in thickness. No hemorrhage. No hydrocephalus. No CT evidence of an acute cortical infarct. No mass effect. No mass lesion. Vascular: No hyperdense vessel or unexpected calcification. Skull: Normal. Negative for fracture or focal lesion. Sinuses/Orbits: No middle ear or mastoid effusion. Paranasal sinuses are clear. Bilateral lens replacement. Orbits are otherwise unremarkable. Other: None. CT CERVICAL SPINE FINDINGS Alignment: Trace retrolisthesis of C5 on C6. Skull base and vertebrae: No acute fracture. No primary bone lesion or focal pathologic process. Soft tissues and spinal canal: No prevertebral fluid or swelling. No visible canal hematoma. Disc levels:  No CT evidence of high-grade spinal canal stenosis. Upper chest: Negative. Other: None IMPRESSION: 1. Low-density extra-axial fluid collections along the right cerebral convexity is favored to represent prominent subarachnoid spaces related to volume loss. Low-density fluid collection along the left cerebral convexity could represent a chronic subdural hematoma measuring 2 mm in  thickness. No mass effect. Recommend short term follow up CT to ensure stability. 2. No acute fracture or traumatic subluxation of the cervical spine. Electronically Signed   By: Lorenza Cambridge M.D.   On: 06/01/2023 14:22    Procedures Procedures    Medications Ordered in ED Medications - No data to display  ED Course/ Medical Decision Making/ A&P                                 Medical Decision Making Amount and/or Complexity of Data Reviewed Radiology: ordered.   This patient presents to the ED for concern of fall, this involves an extensive number of treatment options, and is a complaint that carries with it a high risk of complications and morbidity.  The differential  diagnosis includes acute injuries   Co morbidities that complicate the patient evaluation  atrial fibrillation, CHF, GERD, HTN, HLD, DM   Additional history obtained:  Additional history obtained from N/A External records from outside source obtained and reviewed including EMR   Imaging Studies ordered:  I ordered imaging studies including CT of head and cervical spine, chest x-ray I independently visualized and interpreted imaging which showed no acute hemorrhage present.  There were some areas of low-density extra-axial fluid collections consistent with prominent subarachnoid spaces and/or possible chronic subdural hematoma. I agree with the radiologist interpretation   Cardiac Monitoring: / EKG:  The patient was maintained on a cardiac monitor.  I personally viewed and interpreted the cardiac monitored which showed an underlying rhythm of: Atrial fibrillation  Problem List / ED Course / Critical interventions / Medication management  Patient presenting after a fall.  This fall occurred early this morning, while sitting on the edge of his bed.  He thinks he probably fell asleep while he was sitting there.  He is on Xarelto and did strike his head.  On arrival in the ED, he is well-appearing.  He is able  to ambulate without difficulty.  He has no focal neurologic deficits.  He denies any areas of discomfort other than some mild pain in his right temporal area.  A bruise is present.  Skin is intact.  CT imaging was ordered to assess for intracranial injury.  On CT imaging, no acute findings were identified.  He does have what may be a chronic SDH.  He was informed of this and advised to obtain repeat CT scan in the next few weeks.  Given reassuring results of imaging studies and absence of any concerning symptoms, patient stable for discharge.   Social Determinants of Health:  Has access to outpatient care        Final Clinical Impression(s) / ED Diagnoses Final diagnoses:  Fall, initial encounter    Rx / DC Orders ED Discharge Orders     None         Gloris Manchester, MD 06/01/23 1456

## 2023-06-01 NOTE — Discharge Instructions (Signed)
Your CT scan today showed what may be a small, old collection of blood.  You should get this reimaged in the next few weeks.  Talk to primary care doctor about this.  More importantly, there were no new findings on your CT scan.  Return to the emergency department for any new or worsening symptoms of concern.

## 2023-06-01 NOTE — ED Triage Notes (Signed)
Pt states he has neuropathy in his feet and thinks that what happened was he sat up to dangle his legs off the bed and fell asleep sitting up and hit his head on the dresser. This happened around 2am this morning. Pt is on xerelto.

## 2023-06-02 NOTE — Telephone Encounter (Signed)
Patient's wife Liborio Nixon informed and verbalized understanding of plan.

## 2023-06-03 ENCOUNTER — Ambulatory Visit (INDEPENDENT_AMBULATORY_CARE_PROVIDER_SITE_OTHER): Payer: Medicare Other

## 2023-06-03 DIAGNOSIS — I428 Other cardiomyopathies: Secondary | ICD-10-CM | POA: Diagnosis not present

## 2023-06-06 LAB — CUP PACEART REMOTE DEVICE CHECK
Battery Remaining Longevity: 102 mo
Battery Remaining Percentage: 88 %
Brady Statistic RV Percent Paced: 5 %
Date Time Interrogation Session: 20241025005100
HighPow Impedance: 47 Ohm
Implantable Lead Connection Status: 753985
Implantable Lead Implant Date: 20160531
Implantable Lead Location: 753860
Implantable Lead Model: 296
Implantable Lead Serial Number: 129098
Implantable Pulse Generator Implant Date: 20160531
Lead Channel Impedance Value: 414 Ohm
Lead Channel Pacing Threshold Amplitude: 0.9 V
Lead Channel Pacing Threshold Pulse Width: 0.4 ms
Lead Channel Setting Pacing Amplitude: 2.5 V
Lead Channel Setting Pacing Pulse Width: 0.4 ms
Lead Channel Setting Sensing Sensitivity: 0.6 mV
Pulse Gen Serial Number: 206098
Zone Setting Status: 755011

## 2023-06-13 ENCOUNTER — Telehealth: Payer: Self-pay | Admitting: Cardiology

## 2023-06-13 NOTE — Telephone Encounter (Signed)
Pt c/o medication issue:  1. Name of Medication:   sacubitril-valsartan (ENTRESTO) 24-26 MG   2. How are you currently taking this medication (dosage and times per day)?   As prescribed  3. Are you having a reaction (difficulty breathing--STAT)?   No  4. What is your medication issue?   Wife called to follow-up with Shawnie Dapper, NP regarding patient needs assistance getting this medication.

## 2023-06-13 NOTE — Telephone Encounter (Signed)
Patient verbalized understanding to bring in their 2024 tax return to file with the new paperwork for 2025 when they receive it

## 2023-06-21 NOTE — Progress Notes (Signed)
Remote ICD transmission.   

## 2023-06-27 ENCOUNTER — Other Ambulatory Visit (HOSPITAL_COMMUNITY): Payer: Self-pay | Admitting: Internal Medicine

## 2023-06-27 DIAGNOSIS — I6203 Nontraumatic chronic subdural hemorrhage: Secondary | ICD-10-CM

## 2023-07-11 ENCOUNTER — Ambulatory Visit (HOSPITAL_COMMUNITY): Payer: Medicare Other

## 2023-07-11 ENCOUNTER — Telehealth: Payer: Self-pay | Admitting: Cardiology

## 2023-07-11 MED ORDER — ENTRESTO 24-26 MG PO TABS: 1.0000 | ORAL_TABLET | Freq: Two times a day (BID) | ORAL | 1 refills | Status: DC

## 2023-07-11 NOTE — Telephone Encounter (Signed)
*  STAT* If patient is at the pharmacy, call can be transferred to refill team.   1. Which medications need to be refilled? (please list name of each medication and dose if known) sacubitril-valsartan (ENTRESTO) 24-26 MG   2. Which pharmacy/location (including street and city if local pharmacy) is medication to be sent to? [facility] - [place], [place] - [address]   3. Do they need a 30 day or 90 day supply? 90

## 2023-07-11 NOTE — Telephone Encounter (Signed)
Refill sent.

## 2023-07-14 ENCOUNTER — Other Ambulatory Visit: Payer: Self-pay

## 2023-07-14 MED ORDER — ENTRESTO 24-26 MG PO TABS: 1.0000 | ORAL_TABLET | Freq: Two times a day (BID) | ORAL | 3 refills | Status: DC

## 2023-07-21 ENCOUNTER — Encounter (HOSPITAL_COMMUNITY): Payer: Self-pay | Admitting: *Deleted

## 2023-07-21 ENCOUNTER — Emergency Department (HOSPITAL_COMMUNITY)
Admission: EM | Admit: 2023-07-21 | Discharge: 2023-07-21 | Disposition: A | Discharge status: Home / Self Care | Payer: Medicare Other | Attending: Emergency Medicine | Admitting: Emergency Medicine

## 2023-07-21 ENCOUNTER — Other Ambulatory Visit: Payer: Self-pay

## 2023-07-21 ENCOUNTER — Ambulatory Visit (HOSPITAL_COMMUNITY)
Admission: RE | Admit: 2023-07-21 | Discharge: 2023-07-21 | Disposition: A | Discharge status: Home / Self Care | Payer: Medicare Other | Source: Ambulatory Visit | Attending: Internal Medicine | Admitting: Internal Medicine

## 2023-07-21 DIAGNOSIS — W01198A Fall on same level from slipping, tripping and stumbling with subsequent striking against other object, initial encounter: Secondary | ICD-10-CM | POA: Insufficient documentation

## 2023-07-21 DIAGNOSIS — I110 Hypertensive heart disease with heart failure: Secondary | ICD-10-CM | POA: Insufficient documentation

## 2023-07-21 DIAGNOSIS — S065XAA Traumatic subdural hemorrhage with loss of consciousness status unknown, initial encounter: Secondary | ICD-10-CM | POA: Insufficient documentation

## 2023-07-21 DIAGNOSIS — I4891 Unspecified atrial fibrillation: Secondary | ICD-10-CM | POA: Diagnosis not present

## 2023-07-21 DIAGNOSIS — S0093XA Contusion of unspecified part of head, initial encounter: Secondary | ICD-10-CM | POA: Insufficient documentation

## 2023-07-21 DIAGNOSIS — W06XXXA Fall from bed, initial encounter: Secondary | ICD-10-CM | POA: Insufficient documentation

## 2023-07-21 DIAGNOSIS — Z7901 Long term (current) use of anticoagulants: Secondary | ICD-10-CM | POA: Diagnosis not present

## 2023-07-21 DIAGNOSIS — Z87891 Personal history of nicotine dependence: Secondary | ICD-10-CM | POA: Insufficient documentation

## 2023-07-21 DIAGNOSIS — Z85828 Personal history of other malignant neoplasm of skin: Secondary | ICD-10-CM | POA: Diagnosis not present

## 2023-07-21 DIAGNOSIS — E119 Type 2 diabetes mellitus without complications: Secondary | ICD-10-CM | POA: Insufficient documentation

## 2023-07-21 DIAGNOSIS — E039 Hypothyroidism, unspecified: Secondary | ICD-10-CM | POA: Insufficient documentation

## 2023-07-21 DIAGNOSIS — I6203 Nontraumatic chronic subdural hemorrhage: Secondary | ICD-10-CM

## 2023-07-21 DIAGNOSIS — I5022 Chronic systolic (congestive) heart failure: Secondary | ICD-10-CM | POA: Insufficient documentation

## 2023-07-21 DIAGNOSIS — Z79899 Other long term (current) drug therapy: Secondary | ICD-10-CM | POA: Insufficient documentation

## 2023-07-21 DIAGNOSIS — S0990XA Unspecified injury of head, initial encounter: Secondary | ICD-10-CM | POA: Diagnosis present

## 2023-07-21 NOTE — ED Triage Notes (Signed)
Pt had OP head CT and sent back here for abnormal results.  Pt states he fell about 5-6 weeks, hit head on chest.  Pt is on Xarelto.

## 2023-07-21 NOTE — Discharge Instructions (Addendum)
Per neurosurgery Dr. Conchita Paris please hold your blood thinner/xarelto until seen in the office.  Please call office tomorrow morning to arrange appointment in approximately 1 to 2 weeks. This is to prevent the bleeding in your brain from getting any worse.   It was a pleasure caring for you today in the emergency department.  Please return to the emergency department for any worsening or worrisome symptoms.

## 2023-07-21 NOTE — ED Provider Notes (Signed)
[place] EMERGENCY DEPARTMENT AT [facility] Provider Note  CSN: [id number] Arrival date & time: 07/21/23 1502  Chief Complaint(s) Head Injury  HPI Marvin Sanchez is a 88 y.o. male with past medical history as below, significant for cardiomyopathy, A-fib on Xarelto, systolic heart failure, type II DM who presents to the ED with complaint of abnormal head CT  Patient had a fall over a month ago, had outpatient head CT ordered by his PCP, was found to have subdural hematoma, sent to the ED for evaluation  Larey Seat out of bed around 5 wks ago, hit head on chest/dresser at the bedside, no LOC. Had mild headache after, was seen in the ED 10/23 and [facility] shows possible chronic SDH. Discharged in stable condition. Had rpt CTH through PCP today and was concerning for new SDH with midline shift and reported to ED for eval from PCP. He has been feeling well o/w, he has mild headache intermittently, no focal neuro deficits or nausea/vomiting/vision changes. He is compliant with xarelto, takes at bedtime. Denies any further falls or head injuries   Past Medical History Past Medical History:  Diagnosis Date   Allergic rhinitis    Cardiac defibrillator in place 2016   [facility] Scientific Dynagen EL ICD DF4 VR model # D150, serial [id number]    Cardiomyopathy (HCC)    LVEF 25-30%   Chronic atrial fibrillation (HCC)    Chronic systolic heart failure (HCC)    Esophageal polyp    Essential hypertension    GERD (gastroesophageal reflux disease)    History of skin cancer    Squamous cell   Hyperlipidemia    Hypothyroidism    Nonischemic cardiomyopathy (HCC)    Normal coronary arteries at cardiac catheterization 2015   Osteoarthritis    Renal calculi    Type 2 diabetes mellitus (HCC)    Vitamin D deficiency    Patient Active Problem List   Diagnosis Date Noted   Anemia 02/19/2020   Home Medication(s) Prior to Admission medications   Medication Sig Start Date End Date Taking? Authorizing  Provider  acetaminophen (TYLENOL) 500 MG tablet Take 500 mg by mouth every 6 (six) hours as needed for mild pain or headache.   Yes [provider]  cetirizine (ZYRTEC) 10 MG tablet Take 10 mg by mouth daily as needed for allergies.   Yes [provider]  Cholecalciferol (VITAMIN D-3) 25 MCG (1000 UT) CAPS Take 1,000 Units by mouth daily.   Yes [provider]  cromolyn (NASALCROM) 5.2 MG/ACT nasal spray Place 1 spray into both nostrils daily as needed for allergies or rhinitis.   Yes [provider]  Cyanocobalamin 1000 MCG/ML KIT Inject 1,000 mcg as directed every 30 (thirty) days.   Yes [provider]  FARXIGA 10 MG TABS tablet Take 10 mg by mouth every morning. 11/03/21  Yes [provider]  furosemide (LASIX) 20 MG tablet Take 1 tablet (20 mg total) by mouth as needed (swelling or weight gain). Patient taking differently: Take 20 mg by mouth 3 (three) times a week. Monday, Wednesday, Friday 04/21/23  Yes Jonelle Sidle, MD  gabapentin (NEURONTIN) 300 MG capsule Take 300 mg by mouth 2 (two) times daily.   Yes [provider]  levothyroxine (SYNTHROID, LEVOTHROID) 25 MCG tablet Take 25 mcg by mouth daily before breakfast.   Yes [provider]  metoprolol succinate (TOPROL-XL) 100 MG 24 hr tablet Take 1 tablet (100 mg total) by mouth daily. Take with or immediately  following a meal. Patient taking differently: Take 100 mg by mouth in the morning and at bedtime. Take with or immediately following a meal. 05/23/23  Yes Sharlene Dory, NP  omeprazole (PRILOSEC) 20 MG capsule Take 20 mg by mouth daily.   Yes [provider]  Rivaroxaban (XARELTO) 15 MG TABS tablet TAKE 1 TABLET DAILY WITH  SUPPER 07/09/22  Yes Jonelle Sidle, MD  sacubitril-valsartan (ENTRESTO) 24-26 MG Take 1 tablet by mouth 2 (two) times daily. 07/14/23  Yes Jonelle Sidle, MD                                                                                                                                     Past Surgical History Past Surgical History:  Procedure Laterality Date   BIOPSY  03/14/2020   Procedure: BIOPSY;  Surgeon: Dolores Frame, MD;  Location: [facility];  Service: Gastroenterology;;  duodenal   CARDIAC CATHETERIZATION  09/2013   CARDIAC DEFIBRILLATOR PLACEMENT  01/07/2015   [employer] Dynagen VR ICD implanted by [employer] physicians in [place]   CATARACT EXTRACTION     COLONOSCOPY     COLONOSCOPY WITH PROPOFOL N/A 03/14/2020   Procedure: COLONOSCOPY WITH PROPOFOL;  Surgeon: Dolores Frame, MD;  Location: [facility];  Service: Gastroenterology;  Laterality: N/A;  815   ESOPHAGOGASTRODUODENOSCOPY     ESOPHAGOGASTRODUODENOSCOPY (EGD) WITH PROPOFOL N/A 03/14/2020   Procedure: ESOPHAGOGASTRODUODENOSCOPY (EGD) WITH PROPOFOL;  Surgeon: Dolores Frame, MD;  Location: [facility];  Service: Gastroenterology;  Laterality: N/A;   POLYPECTOMY  03/14/2020   Procedure: POLYPECTOMY;  Surgeon: Marguerita Merles, Reuel Boom, MD;  Location: [facility];  Service: Gastroenterology;;  gastric, duodenal,ascending   SKIN CANCER REMOVAL      TONSILLECTOMY     Family History Family History  Problem Relation Age of Onset   Prostate cancer Father    Heart failure Father    Stroke Mother    Diabetes Mellitus II Sister    Diabetes Mellitus II Brother     Social History Social History   Tobacco Use   Smoking status: Former    Current packs/day: 0.00    Types: Cigarettes    Quit date: 08/11/1968    Years since quitting: 54.9    Passive exposure: Never   Smokeless tobacco: Never  Vaping Use   Vaping status: Never Used  Substance Use Topics   Alcohol use: No   Drug use: No   Allergies Losartan potassium-hctz  Review of Systems Review of Systems  Constitutional:  Negative for chills and fever.  Eyes:  Negative for photophobia.  Respiratory:  Negative for shortness of breath.    Cardiovascular:  Negative for chest pain.  Gastrointestinal:  Negative for abdominal pain, nausea and vomiting.  Genitourinary:  Negative for dysuria.  Neurological:  Positive for headaches. Negative for dizziness, seizures, syncope, weakness and light-headedness.  All other systems reviewed and are negative.  Physical Exam Vital Signs  I have reviewed the triage vital signs BP (!) 140/84   Pulse (!) 57   Temp 97.6 F (36.4 C) (Oral)   Resp 18   Ht 5\' 10"  (1.778 m)   Wt 82.6 kg   SpO2 94%   BMI 26.11 kg/m  Physical Exam Vitals and nursing note reviewed.  Constitutional:      General: He is not in acute distress.    Appearance: He is well-developed.  HENT:     Head: Normocephalic and atraumatic.      Right Ear: External ear normal.     Left Ear: External ear normal.     Mouth/Throat:     Mouth: Mucous membranes are moist.  Eyes:     General: No scleral icterus.    Extraocular Movements: Extraocular movements intact.     Pupils: Pupils are equal, round, and reactive to light.  Cardiovascular:     Rate and Rhythm: Normal rate and regular rhythm.     Pulses: Normal pulses.     Heart sounds: Normal heart sounds.  Pulmonary:     Effort: Pulmonary effort is normal. No respiratory distress.  Abdominal:     General: Abdomen is flat.     Palpations: Abdomen is soft.     Tenderness: There is no abdominal tenderness.  Musculoskeletal:     Cervical back: No rigidity.     Right lower leg: No edema.     Left lower leg: No edema.  Skin:    General: Skin is warm and dry.     Capillary Refill: Capillary refill takes less than 2 seconds.  Neurological:     Mental Status: He is alert and oriented to person, place, and time.     GCS: GCS eye subscore is 4. GCS verbal subscore is 5. GCS motor subscore is 6.     Cranial Nerves: Cranial nerves 2-12 are intact. No dysarthria or facial asymmetry.     Sensory: Sensation is intact.     Motor: Motor function is intact. No tremor.      Coordination: Coordination is intact. Finger-Nose-Finger Test normal.     Comments: Strength 5/5 BLUE BLLE   Psychiatric:        Mood and Affect: Mood normal.        Behavior: Behavior normal.     ED Results and Treatments Labs (all labs ordered are listed, but only abnormal results are displayed) Labs Reviewed - No data to display                                                                                                                        Radiology CT HEAD WO CONTRAST ( ) Result Date: 07/21/2023 CLINICAL DATA:  Fall 6 weeks ago.  Subdural hematoma. EXAM: CT HEAD WITHOUT CONTRAST TECHNIQUE: Contiguous axial images were obtained from the base of the skull through the vertex without intravenous contrast. RADIATION DOSE REDUCTION: This exam was performed according to the departmental dose-optimization program  which includes automated exposure control, adjustment of the mA and/or kV according to patient size and/or use of iterative reconstruction technique. COMPARISON:  CT head without contrast 06/01/2023. FINDINGS: Brain: No acute infarct or parenchymal hemorrhage is present. No mass lesion is present. No significant white matter lesions are present. Deep brain nuclei are within normal limits. The ventricles are of normal size. A hyperdense left subdural hematoma is new since the prior exam, measuring up to 5.5 mm in thickness over the left temporal lobe. The hemorrhage extends over the convexity from the anterior frontal lobe to the parietal lobes bilaterally. 2 mm of rightward midline shift is now present. Prominent subarachnoid space on the right is stable. The brainstem and cerebellum are within normal limits. Midline structures are within normal limits. Vascular: Atherosclerotic calcifications are present within the cavernous internal carotid arteries bilaterally. No hyperdense vessel is present. Skull: Calvarium is intact. No focal lytic or blastic lesions are present. No significant  extracranial soft tissue lesion is present. Sinuses/Orbits: The paranasal sinuses and mastoid air cells are clear. Bilateral lens replacements are noted. Globes and orbits are otherwise unremarkable. IMPRESSION: 1. New hyperdense left subdural hematoma, measuring up to 5.5 mm in thickness over the left temporal lobe. 2. 2 mm of rightward midline shift is now present. 3. No other acute intracranial abnormality or significant interval change. These results will be called to the ordering clinician or representative by the Radiologist Assistant, and communication documented in the PACS or [employer]. Electronically Signed   By: Marin Roberts M.D.   On: 07/21/2023 12:59    Pertinent labs & imaging results that were available during my care of the patient were reviewed by me and considered in my medical decision making (see MDM for details).  Medications Ordered in ED Medications - No data to display                                                                                                                                   Procedures .Critical Care  Performed by: Sloan Leiter, DO Authorized by: Sloan Leiter, DO   Critical care provider statement:    Critical care time (minutes):  30   Critical care time was exclusive of:  Separately billable procedures and treating other patients   Critical care was necessary to treat or prevent imminent or life-threatening deterioration of the following conditions:  CNS failure or compromise   Critical care was time spent personally by me on the following activities:  Development of treatment plan with patient or surrogate, discussions with consultants, examination of patient, ordering and review of radiographic studies, ordering and performing treatments and interventions, pulse oximetry, re-evaluation of patient's condition, review of old charts and obtaining history from patient or surrogate Comments:     SDH   (including critical care  time)  Medical Decision Making / ED Course    Medical Decision Making:    Marvin Sanchez  L Gell is a 88 y.o. male with past medical history as below, significant for cardiomyopathy, A-fib on Xarelto, systolic heart failure, type II DM who presents to the ED with complaint of abnormal head CT. The complaint involves an extensive differential diagnosis and also carries with it a high risk of complications and morbidity.  Serious etiology was considered. Ddx includes but is not limited to: Differential diagnoses for head trauma includes subdural hematoma, epidural hematoma, acute concussion, traumatic subarachnoid hemorrhage, cerebral contusions, etc.   Complete initial physical exam performed, notably the patient was in NAD, sitting upright, neuro non-focal.    Reviewed and confirmed nursing documentation for past medical history, family history, social history.  Vital signs reviewed.    Fall w/ head injury around 5 wks ago, abn cth today, exam stable, consult nsgy  Clinical Course as of 07/21/23 1742  Thu Jul 21, 2023  1557 Spoke with Dr. Conchita Paris, recommends holding Xarelto and follow-up in the office in 2 weeks. [SG]    Clinical Course User Index [SG] Tanda Rockers A, DO    Brief summary:     CTH 12/12 "IMPRESSION: 1. New hyperdense left subdural hematoma, measuring up to 5.5 mm in thickness over the left temporal lobe. 2. 2 mm of rightward midline shift is now present. 3. No other acute intracranial abnormality or significant interval change."  Patient with fall over a month ago, no rpt injuries, found to have subdural hematoma.  He is essentially asymptomatic, he has some achiness around the site where he hit his head but otherwise feels normal.  Spoke w/ Neurosurgery, recommends holding AC until follow-up in the office in 1 to 2 weeks.  Discussed with patient family bedside. Agreeable to plan, strict return precautions emphasized  The patient improved significantly and was  discharged in stable condition. Detailed discussions were had with the patient regarding current findings, and need for close f/u with PCP or on call doctor. The patient has been instructed to return immediately if the symptoms worsen in any way for re-evaluation. Patient verbalized understanding and is in agreement with current care plan. All questions answered prior to discharge.           Additional history obtained: -Additional history obtained from family -External records from outside source obtained and reviewed including: Chart review including previous notes, labs, imaging, consultation notes including  O/p CTH    Lab Tests: na  EKG   EKG Interpretation Date/Time:    Ventricular Rate:    PR Interval:    QRS Duration:    QT Interval:    QTC Calculation:   R Axis:      Text Interpretation:           Imaging Studies ordered: na   Medicines ordered and prescription drug management: No orders of the defined types were placed in this encounter.   -I have reviewed the patients home medicines and have made adjustments as needed   Consultations Obtained: I requested consultation with the NSGY DR NUNDKUMAR,  and discussed lab and imaging findings as well as pertinent plan - they recommend: hold xarelto, see in office 1-2 wks   Cardiac Monitoring: Continuous pulse oximetry interpreted by myself, 97% on RA.    Social Determinants of Health:  Diagnosis or treatment significantly limited by social determinants of health: former smoker   Reevaluation: After the interventions noted above, I reevaluated the patient and found that they have stayed the same  Co morbidities that complicate the patient evaluation  Past  Medical History:  Diagnosis Date   Allergic rhinitis    Cardiac defibrillator in place 2016   [facility] [employer] Dynagen EL ICD DF4 VR model # [id number], serial [id number]    Cardiomyopathy (HCC)    LVEF 25-30%   Chronic atrial fibrillation (HCC)     Chronic systolic heart failure (HCC)    Esophageal polyp    Essential hypertension    GERD (gastroesophageal reflux disease)    History of skin cancer    Squamous cell   Hyperlipidemia    Hypothyroidism    Nonischemic cardiomyopathy (HCC)    Normal coronary arteries at cardiac catheterization 2015   Osteoarthritis    Renal calculi    Type 2 diabetes mellitus (HCC)    Vitamin D deficiency       Dispostion: Disposition decision including need for hospitalization was considered, and patient discharged from emergency department.    Final Clinical Impression(s) / ED Diagnoses Final diagnoses:  Subdural hematoma (HCC)  Anticoagulated        Sloan Leiter, DO 07/21/23 1742

## 2023-07-22 ENCOUNTER — Telehealth: Payer: Self-pay | Admitting: Cardiology

## 2023-07-22 NOTE — Telephone Encounter (Signed)
Pt c/o medication issue:  1. Name of Medication:   Rivaroxaban (XARELTO) 15 MG TABS tablet  2. How are you currently taking this medication (dosage and times per day)?   3. Are you having a reaction (difficulty breathing--STAT)?   4. What is your medication issue?   Patient's daughter states neurologist advised to hold Xarelto for 1-2 weeks based on CT results. He was advised to check with Dr. Diona Browner to make sure he agrees. Please advise.

## 2023-07-25 NOTE — Telephone Encounter (Signed)
LM on cell phone for daughter Marvin Sanchez stating Dr Diona Browner is in agreement with neurology in holding Xarelto 1-2 wks due to subdural hematoma post fall.  LM to call back if she has further questions.

## 2023-07-25 NOTE — Telephone Encounter (Signed)
Please advise 

## 2023-08-02 ENCOUNTER — Other Ambulatory Visit (HOSPITAL_COMMUNITY): Payer: Medicare Other

## 2023-08-15 ENCOUNTER — Encounter: Payer: Self-pay | Admitting: *Deleted

## 2023-08-15 ENCOUNTER — Other Ambulatory Visit: Payer: Self-pay | Admitting: *Deleted

## 2023-08-15 DIAGNOSIS — I4821 Permanent atrial fibrillation: Secondary | ICD-10-CM

## 2023-08-15 MED ORDER — RIVAROXABAN 15 MG PO TABS: ORAL_TABLET | ORAL | 1 refills | Status: DC

## 2023-08-15 NOTE — Telephone Encounter (Signed)
 Prescription refill request for Xarelto  received.  Indication: AF Last office visit: 05/23/23  FORBES Crate NP Weight: 81.8kg Age: 89 Scr: 1.97 on 05/25/23  Epic CrCl: 31.14  Based on above findings Xarelto  15mg  daily is the appropriate dose.  Refill approved.

## 2023-08-15 NOTE — Progress Notes (Signed)
 Fax notification received from [facility] that xarelto 15 mg daily approved from 01/01/02025 through 08/09/2024

## 2023-08-17 ENCOUNTER — Other Ambulatory Visit (HOSPITAL_COMMUNITY): Payer: Self-pay | Admitting: Neurosurgery

## 2023-08-17 DIAGNOSIS — S065XAA Traumatic subdural hemorrhage with loss of consciousness status unknown, initial encounter: Secondary | ICD-10-CM

## 2023-08-25 ENCOUNTER — Ambulatory Visit (HOSPITAL_COMMUNITY)
Admission: RE | Admit: 2023-08-25 | Discharge: 2023-08-25 | Disposition: A | Discharge status: Home / Self Care | Payer: Medicare Other | Source: Ambulatory Visit | Attending: Neurosurgery | Admitting: Neurosurgery

## 2023-08-25 DIAGNOSIS — S065XAA Traumatic subdural hemorrhage with loss of consciousness status unknown, initial encounter: Secondary | ICD-10-CM | POA: Insufficient documentation

## 2023-09-02 ENCOUNTER — Ambulatory Visit (INDEPENDENT_AMBULATORY_CARE_PROVIDER_SITE_OTHER): Payer: Medicare Other

## 2023-09-02 DIAGNOSIS — I428 Other cardiomyopathies: Secondary | ICD-10-CM | POA: Diagnosis not present

## 2023-09-02 LAB — CUP PACEART REMOTE DEVICE CHECK
Battery Remaining Longevity: 102 mo
Battery Remaining Percentage: 90 %
Brady Statistic RV Percent Paced: 5 %
Date Time Interrogation Session: 20250124005100
HighPow Impedance: 49 Ohm
Implantable Lead Connection Status: 753985
Implantable Lead Implant Date: 20160531
Implantable Lead Location: 753860
Implantable Lead Model: 296
Implantable Lead Serial Number: 129098
Implantable Pulse Generator Implant Date: 20160531
Lead Channel Impedance Value: 397 Ohm
Lead Channel Pacing Threshold Amplitude: 0.9 V
Lead Channel Pacing Threshold Pulse Width: 0.4 ms
Lead Channel Setting Pacing Amplitude: 2.5 V
Lead Channel Setting Pacing Pulse Width: 0.4 ms
Lead Channel Setting Sensing Sensitivity: 0.6 mV
Pulse Gen Serial Number: 206098
Zone Setting Status: 755011

## 2023-09-05 ENCOUNTER — Encounter: Payer: Self-pay | Admitting: Cardiovascular Disease

## 2023-09-16 ENCOUNTER — Encounter: Payer: Medicare Other | Admitting: Cardiovascular Disease

## 2023-09-23 ENCOUNTER — Encounter: Payer: Self-pay | Admitting: Cardiovascular Disease

## 2023-09-23 ENCOUNTER — Ambulatory Visit: Payer: Medicare Other | Attending: Cardiovascular Disease | Admitting: Cardiovascular Disease

## 2023-09-23 VITALS — BP 118/78 | HR 56 | Ht 70.0 in | Wt 184.2 lb

## 2023-09-23 DIAGNOSIS — I4821 Permanent atrial fibrillation: Secondary | ICD-10-CM | POA: Diagnosis present

## 2023-09-23 DIAGNOSIS — I428 Other cardiomyopathies: Secondary | ICD-10-CM | POA: Insufficient documentation

## 2023-09-23 LAB — CUP PACEART INCLINIC DEVICE CHECK
Date Time Interrogation Session: 20250214111144
HighPow Impedance: 47 Ohm
Implantable Lead Connection Status: 753985
Implantable Lead Implant Date: 20160531
Implantable Lead Location: 753860
Implantable Lead Model: 296
Implantable Lead Serial Number: 129098
Implantable Pulse Generator Implant Date: 20160531
Lead Channel Impedance Value: 384 Ohm
Lead Channel Pacing Threshold Amplitude: 0.9 V
Lead Channel Pacing Threshold Pulse Width: 0.4 ms
Lead Channel Sensing Intrinsic Amplitude: 7.5 mV
Lead Channel Setting Pacing Amplitude: 2.5 V
Lead Channel Setting Pacing Pulse Width: 0.4 ms
Lead Channel Setting Sensing Sensitivity: 0.6 mV
Pulse Gen Serial Number: 206098
Zone Setting Status: 755011

## 2023-09-23 NOTE — Progress Notes (Signed)
   PCP: Kathlee Nations, MD Primary Cardiologist: Dr Diona Browner Primary EP: Dr Jenne Pane is a 89 y.o. male who presents today for routine electrophysiology followup.    Since last being seen in our clinic, the patient reports doing very well.  1 single lives with the pack last October, he mentioned low blood pressures, so metoprolol was decreased.   Today, he denies symptoms of palpitations, chest pain, shortness of breath,  lower extremity edema, dizziness, presyncope, syncope, or ICD shocks.  He has very mild lightheadedness if he stands up too quickly.  Device interrogation shows relatively slow heart rates, 40-70 bpm.     Physical Exam: Vitals:   09/23/23 1057  BP: 118/78  Pulse: (!) 56  SpO2: 97%  Weight: 184 lb 3.2 oz (83.6 kg)  Height: 5\' 10"  (1.778 m)     Gen: Appears comfortable, well-nourished CV: RRR, no dependent edema The device site is normal -- no tenderness, edema, drainage, redness, threatened erosion. Pulm: breathing easily   ICD interrogation- reviewed in detail today,  See PACEART report  Echo 05/07/21- EF 40%   Wt Readings from Last 3 Encounters:  09/23/23 184 lb 3.2 oz (83.6 kg)  07/21/23 182 lb (82.6 kg)  06/01/23 180 lb (81.6 kg)    Assessment and Plan:  1.  Chronic systolic dysfunction/ nonischemic CM euvolemic today Stable on an appropriate medical regimen  2. ICD Normal ICD function See [facility] [employer] report No changes today he is not device dependant today  3. Permanent atrial fibrillation Rate controlled Histograms show rates 40-70 bpm. Metoprolol may be further decreased if needed.  On xarelto for stroke prevention (chads2vasc score of 5) Labs 08/30/2022 reviewed  4. HTN Stable No change required today   Risks, benefits and potential toxicities for medications prescribed and/or refilled reviewed with patient today.   Return in a year Follow-up with Dr Diona Browner as scheduled  Maurice Small, MD 09/23/2023 11:25  AM

## 2023-09-23 NOTE — Patient Instructions (Addendum)
Medication Instructions:  Continue all current medications.   Labwork: none  Testing/Procedures: none  Follow-Up: 6 months   Any Other Special Instructions Will Be Listed Below (If Applicable).   If you need a refill on your cardiac medications before your next appointment, please call your pharmacy.

## 2023-10-03 ENCOUNTER — Telehealth: Payer: Self-pay | Admitting: Cardiology

## 2023-10-03 NOTE — Telephone Encounter (Signed)
 Pt c/o medication issue:  1. Name of Medication:  Entresto   2. How are you currently taking this medication (dosage and times per day)?   3. Are you having a reaction (difficulty breathing--STAT)?   4. What is your medication issue?   Wife states she dropped off tax form for patient assistance for [facility] and she would like updates.

## 2023-10-03 NOTE — Telephone Encounter (Signed)
 Advised wife Liborio Nixon that [employer] PAF application for entresto was faxed today.

## 2023-10-04 NOTE — Telephone Encounter (Signed)
 Fax notification received that entresto 24/26 mg BID approved 10/03/2023 through 08/08/2024.

## 2023-10-12 NOTE — Progress Notes (Signed)
 Remote ICD transmission.

## 2023-11-03 ENCOUNTER — Telehealth: Payer: Self-pay | Admitting: Nurse Practitioner

## 2023-11-03 NOTE — Telephone Encounter (Signed)
 Pt c/o Shortness Of Breath: STAT if SOB developed within the last 24 hours or pt is noticeably SOB on the phone  1. Are you currently SOB (can you hear that pt is SOB on the phone)? No, mostly at night when he lays down  2. How long have you been experiencing SOB?  Several weeks  3. Are you SOB when sitting or when up moving around? Just at night, when he lay down  4. Are you currently experiencing any other symptoms? Feel really tired- wife wants to know if pt might need to be seen sooner

## 2023-11-03 NOTE — Telephone Encounter (Signed)
 Patient wife states there has been some changes since he las seen [place] patient wife states he is now elevating himself at night. She states that he has had a lot of relief since changing sleeping positions at night. She stated he is still tired and weak all the time and asked if he needed to be seen sooner I advised her we currently have no openings sooner. That if symptoms or condition worsens to be checked out by Ed and will send to provider for FYI and or advice  Patient wife informed and verbalized understanding of plan.

## 2023-11-04 NOTE — Telephone Encounter (Signed)
 Patient informed and verbalized understanding of plan. Patient and wife refuse to make this cahgne at this time due to patient getting dehydrated very easily. I informed her of the concern of volume overload she stated they would wait for any changes until after OV and if he worsened he would be seen in ED.

## 2023-11-24 ENCOUNTER — Ambulatory Visit: Payer: Medicare Other | Admitting: Nurse Practitioner

## 2023-12-02 ENCOUNTER — Ambulatory Visit (INDEPENDENT_AMBULATORY_CARE_PROVIDER_SITE_OTHER): Payer: Medicare Other

## 2023-12-02 DIAGNOSIS — I428 Other cardiomyopathies: Secondary | ICD-10-CM

## 2023-12-02 LAB — CUP PACEART REMOTE DEVICE CHECK
Battery Remaining Longevity: 90 mo
Battery Remaining Percentage: 83 %
Brady Statistic RV Percent Paced: 5 %
Date Time Interrogation Session: 20250425005100
HighPow Impedance: 46 Ohm
Implantable Lead Connection Status: 753985
Implantable Lead Implant Date: 20160531
Implantable Lead Location: 753860
Implantable Lead Model: 296
Implantable Lead Serial Number: 129098
Implantable Pulse Generator Implant Date: 20160531
Lead Channel Impedance Value: 424 Ohm
Lead Channel Pacing Threshold Amplitude: 0.9 V
Lead Channel Pacing Threshold Pulse Width: 0.4 ms
Lead Channel Setting Pacing Amplitude: 2.5 V
Lead Channel Setting Pacing Pulse Width: 0.4 ms
Lead Channel Setting Sensing Sensitivity: 0.6 mV
Pulse Gen Serial Number: 206098
Zone Setting Status: 755011

## 2023-12-08 ENCOUNTER — Encounter: Payer: Self-pay | Admitting: Cardiovascular Disease

## 2023-12-29 ENCOUNTER — Ambulatory Visit: Attending: Nurse Practitioner | Admitting: Nurse Practitioner

## 2023-12-29 ENCOUNTER — Encounter: Payer: Self-pay | Admitting: Nurse Practitioner

## 2023-12-29 VITALS — BP 108/60 | HR 72 | Ht 70.0 in | Wt 181.2 lb

## 2023-12-29 DIAGNOSIS — N1832 Chronic kidney disease, stage 3b: Secondary | ICD-10-CM | POA: Insufficient documentation

## 2023-12-29 DIAGNOSIS — I6203 Nontraumatic chronic subdural hemorrhage: Secondary | ICD-10-CM | POA: Insufficient documentation

## 2023-12-29 DIAGNOSIS — I428 Other cardiomyopathies: Secondary | ICD-10-CM | POA: Diagnosis present

## 2023-12-29 DIAGNOSIS — Z95810 Presence of automatic (implantable) cardiac defibrillator: Secondary | ICD-10-CM | POA: Insufficient documentation

## 2023-12-29 DIAGNOSIS — I4821 Permanent atrial fibrillation: Secondary | ICD-10-CM | POA: Diagnosis not present

## 2023-12-29 DIAGNOSIS — I10 Essential (primary) hypertension: Secondary | ICD-10-CM | POA: Diagnosis present

## 2023-12-29 DIAGNOSIS — I5020 Unspecified systolic (congestive) heart failure: Secondary | ICD-10-CM | POA: Insufficient documentation

## 2023-12-29 MED ORDER — METOPROLOL SUCCINATE ER 100 MG PO TB24: 100.0000 mg | ORAL_TABLET | Freq: Every day | ORAL | 1 refills | Status: DC

## 2023-12-29 MED ORDER — FUROSEMIDE 20 MG PO TABS: ORAL_TABLET | ORAL | 1 refills | Status: AC

## 2023-12-29 NOTE — Patient Instructions (Addendum)
 Medication Instructions:  Your physician has recommended you make the following change in your medication:  Please decrease Toprol  XL to 100 Mg daily  Decrease Lasix  to 20 Mg Twice per week   Labwork: None   Testing/Procedures: None   Follow-Up: Your physician recommends that you schedule a follow-up appointment in: 6 months   Any Other Special Instructions Will Be Listed Below (If Applicable).  If you need a refill on your cardiac medications before your next appointment, please call your pharmacy.

## 2023-12-29 NOTE — Progress Notes (Unsigned)
 Cardiology Office Note:  .   Date:  12/29/2023 ID:  Marvin Sanchez, DOB 03-Sep-1934, MRN [id number] PCP: Lenda Quan, MD  [facility] Providers Cardiologist:  Teddie Favre, MD Electrophysiologist:  Jolly Needle, MD (Inactive)    History of Present Illness: .   Marvin Sanchez is a 89 y.o. male with a PMH of HFmrEF, NICM, permanent A-fib, CKD stage IIIb (Dr. Ada Acres), HTN, hx of anemia, polycythemia vera, and history of ICD (followed by EP), peripheral neuropathy, who presents today for 73-month follow-up appointment.  Last seen by Dr. Londa Rival on November 17, 2022.  He was overall doing well at the time.  He was very active and walked his dog every day, denied any change in his stamina.  05/23/2023 - Today he presents for 61-month follow-up appointment with his wife.  Overall doing well.  Wife has noticed recently soft/lower BP's, wants to know if BP medication needs to be adjusted.  Patient is doing well today.  Denies any chest pain, shortness of breath, palpitations, syncope, presyncope, dizziness, orthopnea, PND, swelling or significant weight changes, acute bleeding, or claudication.  ED visit on 06/01/2023 for a fall. Fell while sitting on the edge of the bed, fell forward. Head did hit the ground. Had bruise noted along right temporal area.  CT of the head revealed low-density extra-axial fluid collections along the right cerebral convexity is favored to represent prominent subarachnoid spaces related to volume loss.  Low-density fluid collection along the left cerebral convexity could represent a chronic subdural hematoma measuring 2 mm in thickness.  No mass effect.  Recommend short-term follow-up CT to ensure stability.  There is no acute fracture or traumatic subluxation of the cervical spine.  A repeat CT of the head was obtained in January 2025 that showed a decrease in size of the left convexity subdural hematoma with small residual collection measuring up to 5 mm in the left middle  cranial fossa with no new bleed or no mass effect.  12/29/2023 - Presents today for follow-up.  He is present today with his wife and doing very well.  Denies any acute cardiac complaints or issues.  He has had no more falls in the interim. Says he feels great. Denies any chest pain, shortness of breath, palpitations, syncope, presyncope, dizziness, orthopnea, PND, swelling or significant weight changes, acute bleeding, or claudication.  ROS: Negative.  See HPI.  SH: 1 granddaughter works for the [employer] and another granddaughter works for the [employer]. Another grandchild works as a Charity fundraiser.   Studies Reviewed: Aaron Aas    EKG: EKG is not ordered today.  Echo 05/2023:  1. Left ventricular ejection fraction, by estimation, is 35 to 40%. The  left ventricle has moderately decreased function. The left ventricle  demonstrates global hypokinesis. There is mild left ventricular  hypertrophy. Left ventricular diastolic  parameters are indeterminate. The average left ventricular global  longitudinal strain is -12.5 %. The global longitudinal strain is  abnormal.   2. Right ventricular systolic function is normal. The right ventricular  size is normal.   3. Left atrial size was mild to moderately dilated.   4. Right atrial size was mildly dilated.   5. The mitral valve is normal in structure. Trivial mitral valve  regurgitation. No evidence of mitral stenosis.   6. The tricuspid valve is abnormal.   7. The aortic valve is tricuspid. Aortic valve regurgitation is not  visualized. No aortic stenosis is present.   8. Aortic dilatation noted. There is mild  dilatation of the ascending  aorta, measuring 37 mm.   9. The inferior vena cava is normal in size with greater than 50%  respiratory variability, suggesting right atrial pressure of 3 mmHg.   Comparison(s): EF 40%. Mild to moderately decreased function. Mild LVH.  Echo 03/2022: 1. Left ventricular ejection fraction, by estimation, is approximately   40%. The left ventricle has mild to moderately decreased function. The left ventricle demonstrates global hypokinesis. There is mild left  ventricular hypertrophy. Left ventricular  diastolic parameters are indeterminate. The average left ventricular  global longitudinal strain is -9.7 %. The global longitudinal strain is  abnormal.   2. Right ventricular systolic function is normal. The right ventricular  size is normal. There is normal pulmonary artery systolic pressure. The estimated right ventricular systolic pressure is 26.6 mmHg.   3. Left atrial size was mildly dilated.   4. Right atrial size was upper normal.   5. The mitral valve is degenerative. Mild mitral valve regurgitation.   6. The aortic valve is tricuspid. Aortic valve regurgitation is not  visualized.   7. The inferior vena cava is normal in size with greater than 50%  respiratory variability, suggesting right atrial pressure of 3 mmHg.   Comparison(s): No significant change from prior study. Prior images  reviewed side by side.     Physical Exam:   VS:  BP 108/60   Pulse 72   Ht 5\' 10"  (1.778 m)   Wt 181 lb 3.2 oz (82.2 kg)   SpO2 99%   BMI 26.00 kg/m    Wt Readings from Last 3 Encounters:  12/29/23 181 lb 3.2 oz (82.2 kg)  09/23/23 184 lb 3.2 oz (83.6 kg)  07/21/23 182 lb (82.6 kg)    GEN: Well nourished, well developed in no acute distress NECK: No JVD; No carotid bruits CARDIAC: S1/S2, irregular irregular rhythm, no murmurs, rubs, gallops RESPIRATORY:  Clear to auscultation without rales, wheezing or rhonchi  EXTREMITIES:  No edema; No deformity   ASSESSMENT AND PLAN: .    HFrEF, NICM Stage C, NYHA class I symptoms. NICM.  Echo 05/2023 revealed EF 35-40%.  Euvolemic and well compensated on exam..  Will reduce metoprolol  succinate to 100 mg daily and decrease Lasix  to take this twice per week instead of 3 times per week.  Continue Farxiga,  and Entresto .  Low sodium diet, fluid restriction <2L, and daily  weights encouraged. Educated to contact our office for weight gain of 2 lbs overnight or 5 lbs in one week.    Permanent A-fib, hx of chronic SDH Denies any tachycardia or palpitations.  Reducing metoprolol  succinate as mentioned above.  Denies any bleeding issues on Xarelto .  Denies any recent falls. See most recent CT scans of head as noted above - followed by neurosurgeon. He is on appropriate dosage of Xarelto . Have low threshold to d/c OAC if he were to have recurrent falls. Denies any recurrent falls. Heart healthy diet and regular cardiovascular exercise encouraged.   3. HTN Blood pressure soft.  Reducing metoprolol  succinate as well as reducing Lasix  frequency as noted above.  Continue rest of medication regimen. Discussed to monitor BP at home at least 2 hours after medications and sitting for 5-10 minutes. Heart healthy diet and regular cardiovascular exercise encouraged.   4. ICD Denies any shocks or issues.  Normal device function seen with the last remote device check.  Continue follow-up with EP as scheduled.  5. CKD stage 3b Labs from June 2024 were  stable.  Avoid nephrotoxic agents.  Continue follow-up with PCP and nephrology as scheduled.  Dispo:   Follow-up with me/APP in 6 months or sooner if anything changes.  Signed, Lasalle Pointer, NP

## 2024-01-09 NOTE — Progress Notes (Signed)
 Remote ICD transmission.

## 2024-01-16 ENCOUNTER — Other Ambulatory Visit: Payer: Self-pay | Admitting: Pharmacist

## 2024-01-16 DIAGNOSIS — I4821 Permanent atrial fibrillation: Secondary | ICD-10-CM

## 2024-01-16 MED ORDER — RIVAROXABAN 15 MG PO TABS: ORAL_TABLET | ORAL | 1 refills | Status: DC

## 2024-02-26 ENCOUNTER — Other Ambulatory Visit: Payer: Self-pay | Admitting: Nurse Practitioner

## 2024-03-02 ENCOUNTER — Ambulatory Visit

## 2024-03-02 DIAGNOSIS — I428 Other cardiomyopathies: Secondary | ICD-10-CM | POA: Diagnosis not present

## 2024-03-02 LAB — CUP PACEART REMOTE DEVICE CHECK
Battery Remaining Longevity: 96 mo
Battery Remaining Percentage: 83 %
Brady Statistic RV Percent Paced: 4 %
Date Time Interrogation Session: 20250725005100
HighPow Impedance: 47 Ohm
Implantable Lead Connection Status: 753985
Implantable Lead Implant Date: 20160531
Implantable Lead Location: 753860
Implantable Lead Model: 296
Implantable Lead Serial Number: 129098
Implantable Pulse Generator Implant Date: 20160531
Lead Channel Impedance Value: 386 Ohm
Lead Channel Pacing Threshold Amplitude: 0.9 V
Lead Channel Pacing Threshold Pulse Width: 0.4 ms
Lead Channel Setting Pacing Amplitude: 2.5 V
Lead Channel Setting Pacing Pulse Width: 0.4 ms
Lead Channel Setting Sensing Sensitivity: 0.6 mV
Pulse Gen Serial Number: 206098
Zone Setting Status: 755011

## 2024-03-04 ENCOUNTER — Ambulatory Visit: Payer: Self-pay | Admitting: Cardiovascular Disease

## 2024-03-09 ENCOUNTER — Ambulatory Visit: Payer: Medicare Other | Attending: Cardiovascular Disease | Admitting: Cardiovascular Disease

## 2024-03-09 ENCOUNTER — Encounter: Payer: Self-pay | Admitting: Cardiovascular Disease

## 2024-03-09 VITALS — BP 131/67 | HR 86 | Ht 70.0 in | Wt 181.0 lb

## 2024-03-09 DIAGNOSIS — I5022 Chronic systolic (congestive) heart failure: Secondary | ICD-10-CM | POA: Diagnosis not present

## 2024-03-09 DIAGNOSIS — Z95810 Presence of automatic (implantable) cardiac defibrillator: Secondary | ICD-10-CM | POA: Insufficient documentation

## 2024-03-09 DIAGNOSIS — I428 Other cardiomyopathies: Secondary | ICD-10-CM

## 2024-03-09 NOTE — Patient Instructions (Signed)
 Medication Instructions:  Continue all current medications.   Labwork: none  Testing/Procedures: none  Follow-Up: 6 months   Any Other Special Instructions Will Be Listed Below (If Applicable).   If you need a refill on your cardiac medications before your next appointment, please call your pharmacy.

## 2024-03-09 NOTE — Progress Notes (Signed)
   PCP: Graydon Mt, MD Primary Cardiologist: Dr Debera Primary EP: Dr Nancey Lupita LITTIE Marvin Sanchez is a 89 y.o. male who presents today for routine electrophysiology followup.    Since last being seen in our clinic, the patient reports doing very well.  1 single lives with the pack last October, he mentioned low blood pressures, so metoprolol  was decreased.   Today, he denies symptoms of palpitations, chest pain, shortness of breath,  lower extremity edema, dizziness, presyncope, syncope, or ICD shocks.  He has very mild lightheadedness if he stands up too quickly.  Device interrogation shows relatively slow heart rates, 40-70 bpm.     Physical Exam: Vitals:   03/09/24 1031  BP: 131/67  Pulse: 86  SpO2: 96%  Weight: 181 lb (82.1 kg)  Height: 5' 10 (1.778 m)      Gen: Appears comfortable, well-nourished CV: RRR, no dependent edema The device site is normal -- no tenderness, edema, drainage, redness, threatened erosion. Pulm: breathing easily   ICD interrogation- reviewed in detail today,  See PACEART report  Echo 05/2023 EF 35-40%%   Wt Readings from Last 3 Encounters:  03/09/24 181 lb (82.1 kg)  12/29/23 181 lb 3.2 oz (82.2 kg)  09/23/23 184 lb 3.2 oz (83.6 kg)    Assessment and Plan:  1.  Chronic systolic dysfunction/ nonischemic CM euvolemic today Stable on an appropriate medical regimen  2. ICD Normal ICD function See Elisabeth Art report No changes today he is not device dependant today  3. Permanent atrial fibrillation Rate controlled Histograms show rates 40-70 bpm. Metoprolol  may be further decreased if needed.  On xarelto  15mg  for stroke prevention (chads2vasc score of 5) Will order CBC and BMP today.  4. HTN Stable No change required today   Risks, benefits and potential toxicities for medications prescribed and/or refilled reviewed with patient today.   Return in a year Follow-up with Dr Debera as scheduled  Eulas FORBES Nancey,  MD 03/09/2024 11:27 AM

## 2024-03-13 ENCOUNTER — Encounter: Payer: Self-pay | Admitting: *Deleted

## 2024-03-14 ENCOUNTER — Encounter: Payer: Self-pay | Admitting: Internal Medicine

## 2024-04-06 ENCOUNTER — Other Ambulatory Visit: Payer: Self-pay

## 2024-04-06 MED ORDER — SACUBITRIL-VALSARTAN 24-26 MG PO TABS: 1.0000 | ORAL_TABLET | Freq: Two times a day (BID) | ORAL | 2 refills | Status: AC

## 2024-05-10 NOTE — Progress Notes (Signed)
 Remote ICD Transmission

## 2024-06-01 ENCOUNTER — Ambulatory Visit

## 2024-06-01 DIAGNOSIS — I4821 Permanent atrial fibrillation: Secondary | ICD-10-CM

## 2024-06-05 ENCOUNTER — Telehealth: Payer: Self-pay | Admitting: Cardiology

## 2024-06-05 NOTE — Telephone Encounter (Signed)
 Wife Audria) called to check if patient's transmission was received.

## 2024-06-11 LAB — CUP PACEART REMOTE DEVICE CHECK
Battery Remaining Longevity: 84 mo
Battery Remaining Percentage: 77 %
Brady Statistic RV Percent Paced: 4 %
Date Time Interrogation Session: 20251024005100
HighPow Impedance: 45 Ohm
Implantable Lead Connection Status: 753985
Implantable Lead Implant Date: 20160531
Implantable Lead Location: 753860
Implantable Lead Model: 296
Implantable Lead Serial Number: 129098
Implantable Pulse Generator Implant Date: 20160531
Lead Channel Impedance Value: 429 Ohm
Lead Channel Pacing Threshold Amplitude: 0.9 V
Lead Channel Pacing Threshold Pulse Width: 0.4 ms
Lead Channel Setting Pacing Amplitude: 2.5 V
Lead Channel Setting Pacing Pulse Width: 0.4 ms
Lead Channel Setting Sensing Sensitivity: 0.5 mV
Pulse Gen Serial Number: 206098
Zone Setting Status: 755011

## 2024-06-13 ENCOUNTER — Ambulatory Visit: Payer: Self-pay | Admitting: Cardiovascular Disease

## 2024-06-13 NOTE — Progress Notes (Signed)
 Remote ICD Transmission

## 2024-06-25 ENCOUNTER — Other Ambulatory Visit: Payer: Self-pay

## 2024-06-27 MED ORDER — METOPROLOL SUCCINATE ER 100 MG PO TB24: 100.0000 mg | ORAL_TABLET | Freq: Every day | ORAL | 1 refills | Status: AC

## 2024-06-29 ENCOUNTER — Ambulatory Visit: Attending: Nurse Practitioner | Admitting: Nurse Practitioner

## 2024-06-29 ENCOUNTER — Encounter: Payer: Self-pay | Admitting: Nurse Practitioner

## 2024-06-29 VITALS — BP 114/70 | HR 74 | Ht 69.0 in | Wt 182.0 lb

## 2024-06-29 DIAGNOSIS — I6203 Nontraumatic chronic subdural hemorrhage: Secondary | ICD-10-CM | POA: Diagnosis present

## 2024-06-29 DIAGNOSIS — N1832 Chronic kidney disease, stage 3b: Secondary | ICD-10-CM | POA: Insufficient documentation

## 2024-06-29 DIAGNOSIS — I10 Essential (primary) hypertension: Secondary | ICD-10-CM | POA: Insufficient documentation

## 2024-06-29 DIAGNOSIS — I428 Other cardiomyopathies: Secondary | ICD-10-CM | POA: Insufficient documentation

## 2024-06-29 DIAGNOSIS — I5020 Unspecified systolic (congestive) heart failure: Secondary | ICD-10-CM | POA: Insufficient documentation

## 2024-06-29 DIAGNOSIS — Z95810 Presence of automatic (implantable) cardiac defibrillator: Secondary | ICD-10-CM | POA: Insufficient documentation

## 2024-06-29 DIAGNOSIS — I4821 Permanent atrial fibrillation: Secondary | ICD-10-CM | POA: Insufficient documentation

## 2024-06-29 NOTE — Patient Instructions (Signed)
 Medication Instructions:  Continue all current medications.   Labwork: none  Testing/Procedures: none  Follow-Up: 6 months   Any Other Special Instructions Will Be Listed Below (If Applicable).   If you need a refill on your cardiac medications before your next appointment, please call your pharmacy.

## 2024-06-29 NOTE — Progress Notes (Signed)
 Cardiology Office Note:  .   Date:  06/29/2024 ID:  Marvin Sanchez, DOB July 07, 1935, MRN [id number] PCP: Graydon Mt, MD  [facility] Providers Cardiologist:  Jayson Sierras, MD Electrophysiologist:  Eulas FORBES Furbish, MD    History of Present Illness: .   Marvin Sanchez is a 89 y.o. male with a PMH of HFmrEF, NICM, permanent A-fib, CKD stage IIIb (Dr. Perri), HTN, hx of anemia, polycythemia vera, and history of ICD (followed by EP), peripheral neuropathy, who presents today for 36-month follow-up appointment.  Last seen by Dr. Sierras on November 17, 2022.  He was overall doing well at the time.  He was very active and walked his dog every day, denied any change in his stamina.  05/23/2023 - Today he presents for 5-month follow-up appointment with his wife.  Overall doing well.  Wife has noticed recently soft/lower BP's, wants to know if BP medication needs to be adjusted.  Patient is doing well today.  Denies any chest pain, shortness of breath, palpitations, syncope, presyncope, dizziness, orthopnea, PND, swelling or significant weight changes, acute bleeding, or claudication.  ED visit on 06/01/2023 for a fall. Fell while sitting on the edge of the bed, fell forward. Head did hit the ground. Had bruise noted along right temporal area.  CT of the head revealed low-density extra-axial fluid collections along the right cerebral convexity is favored to represent prominent subarachnoid spaces related to volume loss.  Low-density fluid collection along the left cerebral convexity could represent a chronic subdural hematoma measuring 2 mm in thickness.  No mass effect.  Recommend short-term follow-up CT to ensure stability.  There is no acute fracture or traumatic subluxation of the cervical spine.  A repeat CT of the head was obtained in January 2025 that showed a decrease in size of the left convexity subdural hematoma with small residual collection measuring up to 5 mm in the left middle cranial  fossa with no new bleed or no mass effect.  12/29/2023 - Presents today for follow-up.  He is present today with his wife and doing very well.  Denies any acute cardiac complaints or issues.  He has had no more falls in the interim. Says he feels great. Denies any chest pain, shortness of breath, palpitations, syncope, presyncope, dizziness, orthopnea, PND, swelling or significant weight changes, acute bleeding, or claudication.  06/29/2024 - Here for follow-up. Doing well. Denies any chest pain, shortness of breath, palpitations, syncope, presyncope, dizziness, orthopnea, PND, swelling or significant weight changes, acute bleeding, or claudication.  ROS: Negative.  See HPI.  SH: 1 granddaughter works for the [employer] and another granddaughter works for the [employer]. Another grandchild works as a charity fundraiser.   Studies Reviewed: SABRA    EKG: EKG is not ordered today.  Echo 05/2023:  1. Left ventricular ejection fraction, by estimation, is 35 to 40%. The  left ventricle has moderately decreased function. The left ventricle  demonstrates global hypokinesis. There is mild left ventricular  hypertrophy. Left ventricular diastolic  parameters are indeterminate. The average left ventricular global  longitudinal strain is -12.5 %. The global longitudinal strain is  abnormal.   2. Right ventricular systolic function is normal. The right ventricular  size is normal.   3. Left atrial size was mild to moderately dilated.   4. Right atrial size was mildly dilated.   5. The mitral valve is normal in structure. Trivial mitral valve  regurgitation. No evidence of mitral stenosis.   6. The tricuspid valve is  abnormal.   7. The aortic valve is tricuspid. Aortic valve regurgitation is not  visualized. No aortic stenosis is present.   8. Aortic dilatation noted. There is mild dilatation of the ascending  aorta, measuring 37 mm.   9. The inferior vena cava is normal in size with greater than 50%  respiratory  variability, suggesting right atrial pressure of 3 mmHg.   Comparison(s): EF 40%. Mild to moderately decreased function. Mild LVH.  Echo 03/2022: 1. Left ventricular ejection fraction, by estimation, is approximately  40%. The left ventricle has mild to moderately decreased function. The left ventricle demonstrates global hypokinesis. There is mild left  ventricular hypertrophy. Left ventricular  diastolic parameters are indeterminate. The average left ventricular  global longitudinal strain is -9.7 %. The global longitudinal strain is  abnormal.   2. Right ventricular systolic function is normal. The right ventricular  size is normal. There is normal pulmonary artery systolic pressure. The estimated right ventricular systolic pressure is 26.6 mmHg.   3. Left atrial size was mildly dilated.   4. Right atrial size was upper normal.   5. The mitral valve is degenerative. Mild mitral valve regurgitation.   6. The aortic valve is tricuspid. Aortic valve regurgitation is not  visualized.   7. The inferior vena cava is normal in size with greater than 50%  respiratory variability, suggesting right atrial pressure of 3 mmHg.   Comparison(s): No significant change from prior study. Prior images  reviewed side by side.     Physical Exam:   VS:  BP 114/70   Pulse 74   Ht 5' 9 (1.753 m)   Wt 182 lb (82.6 kg)   SpO2 97%   BMI 26.88 kg/m    Wt Readings from Last 3 Encounters:  06/29/24 182 lb (82.6 kg)  03/09/24 181 lb (82.1 kg)  12/29/23 181 lb 3.2 oz (82.2 kg)    GEN: Well nourished, well developed in no acute distress NECK: No JVD; No carotid bruits CARDIAC: S1/S2, irregular irregular rhythm, no murmurs, rubs, gallops RESPIRATORY:  Clear to auscultation without rales, wheezing or rhonchi  EXTREMITIES:  No edema; No deformity   ASSESSMENT AND PLAN: .    HFrEF, NICM Stage C, NYHA class I symptoms. NICM.  Echo 05/2023 revealed EF 35-40%.  Euvolemic and well compensated on exam.   Continue current medication regimen. BP's soft at times, asymptomatic. Low sodium diet, fluid restriction <2L, and daily weights encouraged. Educated to contact our office for weight gain of 2 lbs overnight or 5 lbs in one week.    Permanent A-fib, hx of chronic SDH Denies any tachycardia or palpitations. Denies any bleeding issues on Xarelto .  Denies any recent falls. See most recent CT scans of head as noted above - followed by neurosurgeon. He is on appropriate dosage of Xarelto . Have low threshold to d/c OAC if he were to have recurrent falls. Denies any recurrent falls. Heart healthy diet and regular cardiovascular exercise encouraged.   3. HTN Blood pressure soft per report, asymptomatic. Continue current medication regimen. Discussed to monitor BP at home at least 2 hours after medications and sitting for 5-10 minutes. Heart healthy diet and regular cardiovascular exercise encouraged.   4. ICD Denies any shocks or issues.  Normal device function seen with the last remote device check.  Continue follow-up with EP as scheduled.  5. CKD stage 3b Recent labs were stable.  Avoid nephrotoxic agents.  Continue follow-up with PCP and nephrology as scheduled.  Dispo:  Follow-up with MD/APP in 6 months or sooner if anything changes.  Signed, Almarie Crate, NP

## 2024-07-12 ENCOUNTER — Other Ambulatory Visit: Payer: Self-pay

## 2024-07-12 DIAGNOSIS — I4821 Permanent atrial fibrillation: Secondary | ICD-10-CM

## 2024-07-12 MED ORDER — RIVAROXABAN 15 MG PO TABS: ORAL_TABLET | ORAL | 1 refills | Status: AC

## 2024-07-12 NOTE — Telephone Encounter (Signed)
 Prescription refill request for Xarelto  received.  Indication:afib Last office visit:11/25 Weight:82.6  kg Age:89 Scr:1.92  11/25 CrCl:31.67  ml/min  Prescription refilled

## 2024-08-15 ENCOUNTER — Telehealth: Payer: Self-pay | Admitting: Pharmacy Technician

## 2024-08-15 NOTE — Telephone Encounter (Signed)
" ° °  Xarelto  [employer] 2026 approval "

## 2024-08-31 ENCOUNTER — Ambulatory Visit

## 2024-08-31 DIAGNOSIS — I4821 Permanent atrial fibrillation: Secondary | ICD-10-CM | POA: Diagnosis not present

## 2024-08-31 LAB — CUP PACEART REMOTE DEVICE CHECK
Battery Remaining Longevity: 84 mo
Battery Remaining Percentage: 75 %
Brady Statistic RV Percent Paced: 3 %
Date Time Interrogation Session: 20260123005100
HighPow Impedance: 47 Ohm
Implantable Lead Connection Status: 753985
Implantable Lead Implant Date: 20160531
Implantable Lead Location: 753860
Implantable Lead Model: 296
Implantable Lead Serial Number: 129098
Implantable Pulse Generator Implant Date: 20160531
Lead Channel Impedance Value: 391 Ohm
Lead Channel Pacing Threshold Amplitude: 0.9 V
Lead Channel Pacing Threshold Pulse Width: 0.4 ms
Lead Channel Setting Pacing Amplitude: 2.5 V
Lead Channel Setting Pacing Pulse Width: 0.4 ms
Lead Channel Setting Sensing Sensitivity: 0.5 mV
Pulse Gen Serial Number: 206098
Zone Setting Status: 755011

## 2024-09-03 NOTE — Progress Notes (Signed)
 Remote ICD Transmission

## 2024-09-04 ENCOUNTER — Ambulatory Visit: Payer: Self-pay | Admitting: Cardiovascular Disease

## 2024-10-12 ENCOUNTER — Ambulatory Visit: Admitting: Cardiovascular Disease

## 2024-11-30 ENCOUNTER — Encounter

## 2025-03-01 ENCOUNTER — Encounter
